# Patient Record
Sex: Male | Born: 1960 | Race: Black or African American | Hispanic: No | Smoking: Current every day smoker
Health system: Southern US, Community
[De-identification: ages and names within clinical notes are randomized; demographics above are authoritative.]

## PROBLEM LIST (undated history)

## (undated) ENCOUNTER — Emergency Department (HOSPITAL_COMMUNITY): Admission: EM | Discharge status: Against Medical Advice | Payer: No Typology Code available for payment source

## (undated) DIAGNOSIS — A539 Syphilis, unspecified: Secondary | ICD-10-CM

## (undated) DIAGNOSIS — IMO0002 Reserved for concepts with insufficient information to code with codable children: Secondary | ICD-10-CM

---

## 2002-01-15 ENCOUNTER — Emergency Department (HOSPITAL_COMMUNITY): Admission: EM | Admit: 2002-01-15 | Discharge: 2002-01-15 | Payer: Self-pay | Admitting: *Deleted

## 2003-03-30 ENCOUNTER — Emergency Department (HOSPITAL_COMMUNITY): Admission: EM | Admit: 2003-03-30 | Discharge: 2003-03-30 | Payer: Self-pay | Admitting: Emergency Medicine

## 2006-04-16 ENCOUNTER — Emergency Department (HOSPITAL_COMMUNITY): Admission: EM | Admit: 2006-04-16 | Discharge: 2006-04-16 | Payer: Self-pay | Admitting: Emergency Medicine

## 2007-02-22 ENCOUNTER — Ambulatory Visit: Payer: Self-pay | Admitting: Nurse Practitioner

## 2007-02-23 ENCOUNTER — Ambulatory Visit: Payer: Self-pay | Admitting: *Deleted

## 2007-02-28 ENCOUNTER — Ambulatory Visit: Payer: Self-pay | Admitting: Nurse Practitioner

## 2007-03-07 ENCOUNTER — Ambulatory Visit: Payer: Self-pay | Admitting: Nurse Practitioner

## 2007-03-15 ENCOUNTER — Ambulatory Visit: Payer: Self-pay | Admitting: Nurse Practitioner

## 2007-04-27 ENCOUNTER — Ambulatory Visit: Payer: Self-pay | Admitting: Family Medicine

## 2007-05-07 ENCOUNTER — Emergency Department (HOSPITAL_COMMUNITY): Admission: EM | Admit: 2007-05-07 | Discharge: 2007-05-07 | Payer: Self-pay | Admitting: Emergency Medicine

## 2007-05-19 ENCOUNTER — Emergency Department (HOSPITAL_COMMUNITY): Admission: EM | Admit: 2007-05-19 | Discharge: 2007-05-19 | Payer: Self-pay | Admitting: Family Medicine

## 2007-05-24 ENCOUNTER — Ambulatory Visit: Payer: Self-pay | Admitting: Internal Medicine

## 2007-06-15 ENCOUNTER — Ambulatory Visit (HOSPITAL_COMMUNITY): Admission: RE | Admit: 2007-06-15 | Discharge: 2007-06-15 | Payer: Self-pay | Admitting: Family Medicine

## 2007-07-15 ENCOUNTER — Emergency Department (HOSPITAL_COMMUNITY): Admission: EM | Admit: 2007-07-15 | Discharge: 2007-07-16 | Payer: Self-pay | Admitting: Emergency Medicine

## 2007-07-24 ENCOUNTER — Ambulatory Visit: Payer: Self-pay | Admitting: Internal Medicine

## 2007-09-19 ENCOUNTER — Ambulatory Visit: Payer: Self-pay | Admitting: Internal Medicine

## 2007-12-14 ENCOUNTER — Ambulatory Visit: Payer: Self-pay | Admitting: Internal Medicine

## 2008-01-02 ENCOUNTER — Ambulatory Visit: Payer: Self-pay | Admitting: Internal Medicine

## 2008-01-02 ENCOUNTER — Encounter (INDEPENDENT_AMBULATORY_CARE_PROVIDER_SITE_OTHER): Payer: Self-pay | Admitting: Nurse Practitioner

## 2008-01-02 LAB — CONVERTED CEMR LAB

## 2008-04-01 ENCOUNTER — Ambulatory Visit: Payer: Self-pay | Admitting: Internal Medicine

## 2008-05-17 ENCOUNTER — Ambulatory Visit: Payer: Self-pay | Admitting: Internal Medicine

## 2008-05-22 ENCOUNTER — Emergency Department (HOSPITAL_COMMUNITY): Admission: EM | Admit: 2008-05-22 | Discharge: 2008-05-22 | Payer: Self-pay | Admitting: Emergency Medicine

## 2008-05-28 ENCOUNTER — Emergency Department (HOSPITAL_COMMUNITY): Admission: EM | Admit: 2008-05-28 | Discharge: 2008-05-28 | Payer: Self-pay | Admitting: Emergency Medicine

## 2008-06-03 ENCOUNTER — Ambulatory Visit: Payer: Self-pay | Admitting: Internal Medicine

## 2008-06-03 ENCOUNTER — Encounter: Payer: Self-pay | Admitting: Family Medicine

## 2008-06-03 LAB — CONVERTED CEMR LAB
ALT: 15 units/L (ref 0–53)
Albumin: 4.2 g/dL (ref 3.5–5.2)
BUN: 16 mg/dL (ref 6–23)
Barbiturate Quant, Ur: NEGATIVE
CO2: 24 meq/L (ref 19–32)
Cocaine Metabolites: NEGATIVE
Creatinine, Ser: 1.11 mg/dL (ref 0.40–1.50)
Marijuana Metabolite: POSITIVE — AB
Sed Rate: 2 mm/hr (ref 0–16)
Sodium: 141 meq/L (ref 135–145)
Total Protein: 7.4 g/dL (ref 6.0–8.3)

## 2008-06-05 ENCOUNTER — Emergency Department (HOSPITAL_COMMUNITY): Admission: EM | Admit: 2008-06-05 | Discharge: 2008-06-05 | Payer: Self-pay | Admitting: Emergency Medicine

## 2008-07-31 ENCOUNTER — Emergency Department (HOSPITAL_COMMUNITY): Admission: EM | Admit: 2008-07-31 | Discharge: 2008-07-31 | Payer: Self-pay | Admitting: Emergency Medicine

## 2008-08-12 ENCOUNTER — Emergency Department (HOSPITAL_COMMUNITY): Admission: EM | Admit: 2008-08-12 | Discharge: 2008-08-12 | Payer: Self-pay | Admitting: Emergency Medicine

## 2008-08-16 ENCOUNTER — Emergency Department (HOSPITAL_COMMUNITY): Admission: EM | Admit: 2008-08-16 | Discharge: 2008-08-16 | Payer: Self-pay | Admitting: Emergency Medicine

## 2008-10-02 ENCOUNTER — Emergency Department (HOSPITAL_COMMUNITY): Admission: EM | Admit: 2008-10-02 | Discharge: 2008-10-02 | Payer: Self-pay | Admitting: Emergency Medicine

## 2008-10-23 ENCOUNTER — Emergency Department (HOSPITAL_COMMUNITY): Admission: EM | Admit: 2008-10-23 | Discharge: 2008-10-23 | Payer: Self-pay | Admitting: Emergency Medicine

## 2008-11-06 ENCOUNTER — Ambulatory Visit: Payer: Self-pay | Admitting: Family Medicine

## 2008-11-11 ENCOUNTER — Ambulatory Visit (HOSPITAL_COMMUNITY): Admission: RE | Admit: 2008-11-11 | Discharge: 2008-11-11 | Payer: Self-pay | Admitting: Family Medicine

## 2008-11-14 ENCOUNTER — Emergency Department (HOSPITAL_COMMUNITY): Admission: EM | Admit: 2008-11-14 | Discharge: 2008-11-14 | Payer: Self-pay | Admitting: Emergency Medicine

## 2009-01-16 ENCOUNTER — Observation Stay (HOSPITAL_COMMUNITY): Admission: EM | Admit: 2009-01-16 | Discharge: 2009-01-16 | Payer: Self-pay | Admitting: Emergency Medicine

## 2009-02-05 DIAGNOSIS — M5412 Radiculopathy, cervical region: Secondary | ICD-10-CM | POA: Insufficient documentation

## 2009-02-05 DIAGNOSIS — K7460 Unspecified cirrhosis of liver: Secondary | ICD-10-CM | POA: Insufficient documentation

## 2009-02-05 DIAGNOSIS — M7150 Other bursitis, not elsewhere classified, unspecified site: Secondary | ICD-10-CM | POA: Insufficient documentation

## 2009-02-05 DIAGNOSIS — R079 Chest pain, unspecified: Secondary | ICD-10-CM | POA: Insufficient documentation

## 2009-02-05 DIAGNOSIS — M5430 Sciatica, unspecified side: Secondary | ICD-10-CM | POA: Insufficient documentation

## 2009-04-04 ENCOUNTER — Ambulatory Visit: Payer: Self-pay | Admitting: Internal Medicine

## 2009-04-09 ENCOUNTER — Emergency Department (HOSPITAL_COMMUNITY): Admission: EM | Admit: 2009-04-09 | Discharge: 2009-04-09 | Payer: Self-pay | Admitting: Emergency Medicine

## 2009-06-27 ENCOUNTER — Ambulatory Visit: Payer: Self-pay | Admitting: Family Medicine

## 2009-07-13 ENCOUNTER — Emergency Department (HOSPITAL_COMMUNITY): Admission: EM | Admit: 2009-07-13 | Discharge: 2009-07-14 | Payer: Self-pay | Admitting: Emergency Medicine

## 2009-08-08 ENCOUNTER — Ambulatory Visit: Payer: Self-pay | Admitting: Internal Medicine

## 2009-11-06 ENCOUNTER — Ambulatory Visit: Payer: Self-pay | Admitting: Internal Medicine

## 2009-11-20 ENCOUNTER — Ambulatory Visit: Payer: Self-pay | Admitting: Internal Medicine

## 2009-11-20 LAB — CONVERTED CEMR LAB: Sed Rate: 7 mm/hr (ref 0–16)

## 2009-11-21 ENCOUNTER — Encounter (INDEPENDENT_AMBULATORY_CARE_PROVIDER_SITE_OTHER): Payer: Self-pay | Admitting: Internal Medicine

## 2009-11-27 ENCOUNTER — Ambulatory Visit: Payer: Self-pay | Admitting: Internal Medicine

## 2009-12-02 ENCOUNTER — Inpatient Hospital Stay (HOSPITAL_COMMUNITY): Admission: AC | Admit: 2009-12-02 | Discharge: 2009-12-05 | Payer: Self-pay

## 2010-02-04 ENCOUNTER — Emergency Department (HOSPITAL_COMMUNITY): Admission: EM | Admit: 2010-02-04 | Discharge: 2010-02-04 | Payer: Self-pay | Admitting: Emergency Medicine

## 2010-03-02 ENCOUNTER — Emergency Department (HOSPITAL_COMMUNITY): Admission: EM | Admit: 2010-03-02 | Discharge: 2010-03-02 | Payer: Self-pay | Admitting: Emergency Medicine

## 2010-03-26 ENCOUNTER — Emergency Department (HOSPITAL_COMMUNITY): Admission: EM | Admit: 2010-03-26 | Discharge: 2010-03-27 | Payer: Self-pay | Admitting: Emergency Medicine

## 2010-05-27 ENCOUNTER — Emergency Department (HOSPITAL_COMMUNITY): Admission: EM | Admit: 2010-05-27 | Discharge: 2010-05-27 | Payer: Self-pay | Admitting: Emergency Medicine

## 2010-07-22 ENCOUNTER — Emergency Department (HOSPITAL_COMMUNITY): Admission: EM | Admit: 2010-07-22 | Discharge: 2010-07-23 | Payer: Self-pay | Admitting: Emergency Medicine

## 2010-11-08 ENCOUNTER — Encounter: Payer: Self-pay | Admitting: Family Medicine

## 2010-11-30 ENCOUNTER — Ambulatory Visit: Payer: Self-pay | Admitting: Rehabilitative and Restorative Service Providers"

## 2010-11-30 ENCOUNTER — Emergency Department (HOSPITAL_COMMUNITY)
Admission: EM | Admit: 2010-11-30 | Discharge: 2010-11-30 | Disposition: A | Discharge status: Home / Self Care | Payer: Self-pay | Attending: Emergency Medicine | Admitting: Emergency Medicine

## 2010-11-30 DIAGNOSIS — M545 Low back pain, unspecified: Secondary | ICD-10-CM | POA: Insufficient documentation

## 2010-11-30 DIAGNOSIS — M546 Pain in thoracic spine: Secondary | ICD-10-CM | POA: Insufficient documentation

## 2010-11-30 DIAGNOSIS — G8929 Other chronic pain: Secondary | ICD-10-CM | POA: Insufficient documentation

## 2010-11-30 DIAGNOSIS — M542 Cervicalgia: Secondary | ICD-10-CM | POA: Insufficient documentation

## 2010-12-01 ENCOUNTER — Ambulatory Visit: Payer: Self-pay | Admitting: Rehabilitation

## 2010-12-14 ENCOUNTER — Ambulatory Visit: Payer: Self-pay | Attending: *Deleted | Admitting: Rehabilitative and Restorative Service Providers"

## 2011-01-04 LAB — CBC
HCT: 42.1 % (ref 39.0–52.0)
Hemoglobin: 14.3 g/dL (ref 13.0–17.0)
MCHC: 33.9 g/dL (ref 30.0–36.0)
MCV: 87.6 fL (ref 78.0–100.0)
RDW: 14.5 % (ref 11.5–15.5)
WBC: 11 10*3/uL — ABNORMAL HIGH (ref 4.0–10.5)

## 2011-01-04 LAB — COMPREHENSIVE METABOLIC PANEL
Albumin: 2.8 g/dL — ABNORMAL LOW (ref 3.5–5.2)
Alkaline Phosphatase: 88 U/L (ref 39–117)
BUN: 11 mg/dL (ref 6–23)
Chloride: 103 mEq/L (ref 96–112)
GFR calc Af Amer: >60 mL/min (ref >60)
Total Bilirubin: 0.6 mg/dL (ref 0.3–1.2)

## 2011-01-04 LAB — DIFFERENTIAL
Basophils Relative: 1 % (ref 0–1)
Eosinophils Absolute: 0.2 10*3/uL (ref 0.0–0.7)
Lymphocytes Relative: 19 % (ref 12–46)
Lymphs Abs: 2.1 10*3/uL (ref 0.7–4.0)
Monocytes Absolute: 0.8 10*3/uL (ref 0.1–1.0)
Neutro Abs: 7.8 10*3/uL — ABNORMAL HIGH (ref 1.7–7.7)
Neutrophils Relative %: 71 % (ref 43–77)

## 2011-01-04 LAB — PROTIME-INR: INR: 1.29 (ref 0.00–1.49)

## 2011-01-04 LAB — POCT CARDIAC MARKERS: Troponin i, poc: <0.05 ng/mL (ref 0.00–0.09)

## 2011-01-07 LAB — COMPREHENSIVE METABOLIC PANEL
ALT: 39 U/L (ref 0–53)
AST: 58 U/L — ABNORMAL HIGH (ref 0–37)
Albumin: 3 g/dL — ABNORMAL LOW (ref 3.5–5.2)
Alkaline Phosphatase: 77 U/L (ref 39–117)
BUN: 13 mg/dL (ref 6–23)
GFR calc Af Amer: >60 mL/min (ref >60)
Potassium: 4 mEq/L (ref 3.5–5.1)
Sodium: 137 mEq/L (ref 135–145)
Total Protein: 6.3 g/dL (ref 6.0–8.3)

## 2011-01-07 LAB — CBC
HCT: 39.3 % (ref 39.0–52.0)
Hemoglobin: 13.3 g/dL (ref 13.0–17.0)
Platelets: 222 10*3/uL (ref 150–400)
RBC: 4.45 MIL/uL (ref 4.22–5.81)
WBC: 11.3 10*3/uL — ABNORMAL HIGH (ref 4.0–10.5)

## 2011-01-07 LAB — POCT I-STAT, CHEM 8
Chloride: 108 meq/L (ref 96–112)
Creatinine, Ser: 1.6 mg/dL — ABNORMAL HIGH (ref 0.4–1.5)
Hemoglobin: 13.9 g/dL (ref 13.0–17.0)
Potassium: 3.8 meq/L (ref 3.5–5.1)
Sodium: 140 meq/L (ref 135–145)

## 2011-01-07 LAB — URINALYSIS, ROUTINE W REFLEX MICROSCOPIC
Nitrite: NEGATIVE
Specific Gravity, Urine: 1.01 (ref 1.005–1.030)
Urobilinogen, UA: 0.2 mg/dL (ref 0.0–1.0)

## 2011-01-07 LAB — PROTIME-INR: Prothrombin Time: 13.5 seconds (ref 11.6–15.2)

## 2011-01-07 LAB — RAPID URINE DRUG SCREEN, HOSP PERFORMED
Opiates: NOT DETECTED
Tetrahydrocannabinol: NOT DETECTED

## 2011-01-07 LAB — URINE MICROSCOPIC-ADD ON

## 2011-01-21 ENCOUNTER — Emergency Department (HOSPITAL_COMMUNITY): Payer: Self-pay

## 2011-01-21 ENCOUNTER — Emergency Department (HOSPITAL_COMMUNITY)
Admission: EM | Admit: 2011-01-21 | Discharge: 2011-01-21 | Disposition: A | Discharge status: Home / Self Care | Payer: Self-pay | Attending: Emergency Medicine | Admitting: Emergency Medicine

## 2011-01-21 DIAGNOSIS — R064 Hyperventilation: Secondary | ICD-10-CM | POA: Insufficient documentation

## 2011-01-21 DIAGNOSIS — R079 Chest pain, unspecified: Secondary | ICD-10-CM | POA: Insufficient documentation

## 2011-01-21 DIAGNOSIS — R071 Chest pain on breathing: Secondary | ICD-10-CM | POA: Insufficient documentation

## 2011-01-21 DIAGNOSIS — R0609 Other forms of dyspnea: Secondary | ICD-10-CM | POA: Insufficient documentation

## 2011-01-21 DIAGNOSIS — R0989 Other specified symptoms and signs involving the circulatory and respiratory systems: Secondary | ICD-10-CM | POA: Insufficient documentation

## 2011-01-21 LAB — POCT I-STAT, CHEM 8
BUN: 23 mg/dL (ref 6–23)
Chloride: 109 mEq/L (ref 96–112)
Sodium: 142 mEq/L (ref 135–145)

## 2011-01-21 LAB — POCT CARDIAC MARKERS
CKMB, poc: 3.7 ng/mL (ref 1.0–8.0)
Myoglobin, poc: 78.9 ng/mL (ref 12–200)
Troponin i, poc: <0.05 ng/mL (ref 0.00–0.09)

## 2011-01-27 LAB — CARDIAC PANEL(CRET KIN+CKTOT+MB+TROPI)
CK, MB: 5.7 ng/mL — ABNORMAL HIGH (ref 0.3–4.0)
Relative Index: 1.1 (ref 0.0–2.5)

## 2011-01-27 LAB — POCT CARDIAC MARKERS

## 2011-01-27 LAB — LIPID PANEL
LDL Cholesterol: 56 mg/dL (ref 0–99)
Triglycerides: 31 mg/dL (ref <150)

## 2011-01-27 LAB — TROPONIN I: Troponin I: <0.01 ng/mL (ref 0.00–0.06)

## 2011-01-27 LAB — HOMOCYSTEINE: Homocysteine: 7.9 umol/L (ref 4.0–15.4)

## 2011-01-27 LAB — TSH: TSH: 0.961 u[IU]/mL (ref 0.350–4.500)

## 2011-01-28 LAB — LIPASE, BLOOD: Lipase: 22 U/L (ref 11–59)

## 2011-01-28 LAB — CBC
MCV: 88.1 fL (ref 78.0–100.0)
Platelets: 204 10*3/uL (ref 150–400)
RDW: 14.6 % (ref 11.5–15.5)
WBC: 7.8 10*3/uL (ref 4.0–10.5)

## 2011-01-28 LAB — DIFFERENTIAL
Basophils Relative: 0 % (ref 0–1)
Eosinophils Absolute: 0.2 10*3/uL (ref 0.0–0.7)
Neutro Abs: 5.3 10*3/uL (ref 1.7–7.7)
Neutrophils Relative %: 67 % (ref 43–77)

## 2011-01-28 LAB — COMPREHENSIVE METABOLIC PANEL
AST: 32 U/L (ref 0–37)
Albumin: 3.5 g/dL (ref 3.5–5.2)
Chloride: 109 mEq/L (ref 96–112)
Creatinine, Ser: 1.22 mg/dL (ref 0.4–1.5)
GFR calc Af Amer: >60 mL/min (ref >60)
GFR calc non Af Amer: >60 mL/min (ref >60)
Total Protein: 6.3 g/dL (ref 6.0–8.3)

## 2011-01-28 LAB — ETHANOL: Alcohol, Ethyl (B): 19 mg/dL — ABNORMAL HIGH (ref 0–10)

## 2011-01-28 LAB — RAPID URINE DRUG SCREEN, HOSP PERFORMED: Barbiturates: NOT DETECTED

## 2011-01-28 LAB — D-DIMER, QUANTITATIVE: D-Dimer, Quant: 0.22 ug/mL-FEU (ref 0.00–0.48)

## 2011-01-28 LAB — POCT CARDIAC MARKERS

## 2011-02-17 ENCOUNTER — Emergency Department (HOSPITAL_COMMUNITY)
Admission: EM | Admit: 2011-02-17 | Discharge: 2011-02-17 | Disposition: A | Discharge status: Home / Self Care | Payer: Self-pay | Attending: Emergency Medicine | Admitting: Emergency Medicine

## 2011-02-17 DIAGNOSIS — M545 Low back pain, unspecified: Secondary | ICD-10-CM | POA: Insufficient documentation

## 2011-02-17 DIAGNOSIS — M2560 Stiffness of unspecified joint, not elsewhere classified: Secondary | ICD-10-CM | POA: Insufficient documentation

## 2011-02-17 DIAGNOSIS — M79609 Pain in unspecified limb: Secondary | ICD-10-CM | POA: Insufficient documentation

## 2011-02-17 DIAGNOSIS — M5430 Sciatica, unspecified side: Secondary | ICD-10-CM | POA: Insufficient documentation

## 2011-02-17 DIAGNOSIS — R262 Difficulty in walking, not elsewhere classified: Secondary | ICD-10-CM | POA: Insufficient documentation

## 2011-02-17 DIAGNOSIS — R209 Unspecified disturbances of skin sensation: Secondary | ICD-10-CM | POA: Insufficient documentation

## 2011-02-17 DIAGNOSIS — X58XXXA Exposure to other specified factors, initial encounter: Secondary | ICD-10-CM | POA: Insufficient documentation

## 2011-02-17 DIAGNOSIS — T148XXA Other injury of unspecified body region, initial encounter: Secondary | ICD-10-CM | POA: Insufficient documentation

## 2011-03-02 NOTE — H&P (Signed)
NAME:  Steven, [name] NO.:  [id number]   MEDICAL RECORD NO.:  [id number]          PATIENT TYPE:  INP   LOCATION:  3703                         FACILITY:  [facility]   PHYSICIAN:  Vania Rea, M.D. DATE OF BIRTH:  20-Aug-1961   DATE OF ADMISSION:  01/15/2009  DATE OF DISCHARGE:                              HISTORY & PHYSICAL   PRIMARY CARE PHYSICIAN:  [facility]   CHIEF COMPLAINT:  Chest pain.   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  While  at rest today, he had sudden onset of 8/10 left-sided chest squeezing  pain radiating to the left shoulder, associated with shortness of breath  and a feeling that he was about to pass out.  The patient became very  anxious and felt he should call the ambulance, which he did, and he  arrived to the emergency room within 25 minutes of the onset of his  symptoms.  The patient says the discomfort was relieved by elevating his  left arm above his head and seemed a bit aggravated by all the  questions that he was asked by the EMS personnel.  He had no  diaphoresis.  No nausea nor vomiting.  No palpitations.  In the  emergency room, he said once he laid down on the stretcher and was left  alone, the pain seemed to go away.  It was not  particular relieved by  any medication.  He has no prior history of cardiac disease or cardiac  symptoms.  He does have a mother who had an acute myocardial infarction  in her 20s.  Does not know the health of his father.  He does smoke.  He  does not know his lipid status.   The patient says he sometimes takes Percocet for his  pain but mostly  takes ibuprofen 800 mg daily p.r.n.  He is feeling okay and he is hoping  to be able to go home.   PAST MEDICAL HISTORY:  As noted above:  1. Cervical stenosis.  2. Sciatica.  3. History of bursitis.   MEDICATION:  Ibuprofen 800 mg daily p.r.n.   ALLERGIES:  No known drug allergies.   SOCIAL HISTORY:  He works as a Administrator, is physically very active.  He was working today.  He lives with his aunt.  He smokes 6-7 cigarettes  per day and smokes 1 marijuana cigar per day.  Drinks about three 12  ounces beers per day.  He last used cocaine about 2 weeks ago.   FAMILY HISTORY:  Mother and father as noted above.  His siblings he  feels are all healthy.   REVIEW OF SYSTEMS:  Other than noted above, 10-point review of systems  is unremarkable.  The patient says he can walk 2 blocks briskly  or  climbs stairs briskly without any chest discomfort at all.   PHYSICAL EXAMINATION:  A healthy but anxious looking young middle-aged  African American gentleman reclining on the stretcher, very athletic in  appearance.  VITALS:  His temperature is 97.6, pulse 62, respiration 14, blood  pressure 111/69.  He is saturating at 98% on 2 liters.  HEENT:  Pupils are round and equal.  Mucous membranes pink.  Anicteric.  NECK:  No cervical lymphadenopathy or thyromegaly.  No jugular venous  distention.  No carotid bruit.  CHEST:  Clear to auscultation bilaterally.  CARDIOVASCULAR SYSTEM:  Regular rhythm without murmurs.  ABDOMEN:  Scaphoid, soft and nontender.  There are no masses.  EXTREMITIES:  Without edema.  He has 2+ dorsalis pedis pulses equal  bilaterally.  CENTRAL NERVOUS SYSTEM: Cranial nerves II-XII are grossly intact and has  no focal neurologic deficit.   LABORATORY DATA:  His CBC is reviewed by me and is completely normal,  specifically his hemoglobin is 14.4.  His complete metabolic panel is  reviewed by me and is completely normal.  Incidental he denies any  history  of alcoholic liver disease.  Alcohol level was marginally  elevated at 19.  His lipase is 22.  D-dimer is normal at 0.22.  His  cardiac enzymes initially showed a mild elevation of his myoglobin with  undetectable troponins and CK-MB 4.9.  Repeat cardiac markers were all  normal.  A 2-view chest x-ray shows no active cardiopulmonary disease.  His lungs are clear.  His EKG shows normal sinus rhythm and flattening  of T-waves in the inferior leads.   ASSESSMENT:  1. Atypical chest pain.  2. Polysubstance abuse.  3. History of multiple musculoskeletal disorders in an otherwise      healthy young African American gentleman,   PLAN:  Will bring this gentleman on observation for serial cardiac  enzymes.  If cardiac enzymes are negative, the patient can likely be  followed and referred for outpatient cardiac stress testing since the  indications are that this is musculoskeletal syndrome.  Because of his  history of substance abuse, will go ahead and order an echo to assess  his ejection fraction to see if he has any possible ischemic or  nonischemic damage to his heart.      Vania Rea, M.D.  Electronically Signed     LC/MEDQ  D:  01/16/2009  T:  01/16/2009  Job:  [id number]

## 2011-03-02 NOTE — Discharge Summary (Signed)
NAME:  Steven [name], Steven [name] NO.:  [id number]   MEDICAL RECORD NO.:  [id number]           PATIENT TYPE:   LOCATION:                                 FACILITY:   PHYSICIAN:  Hind Bosie Helper, MD      DATE OF BIRTH:  August 07, 1961   DATE OF ADMISSION:  01/16/2009  DATE OF DISCHARGE:  01/17/2009                               DISCHARGE SUMMARY   DISCHARGE DIAGNOSES:  1. Chest pain, myocardial infarction was ruled out.  2. Nonprescription abuse, mainly tobacco, alcohol, and marijuana and      periodically cocaine.  3. History of cervical stenosis and sciatica.  4. History of bursitis.   DISCHARGE MEDICATIONS:  1. Aspirin 81 mg p.o. daily.  2. Ibuprofen 800 mg daily p.r.n.   PROCEDURE:  Chest x-ray negative for acute cardiopulmonary process.   HISTORY OF PRESENT ILLNESS:  This is a 50 year old male with a history  of chronic pain, mainly bursitis, sciatica, cervical spondylosis,  admitted with left-sided chest pain, mainly squeezing, radiating to the  left shoulder, associated with shortness of breath and feeling about to  pass out, and the pain relieved by elevating his left arm above his  head.  No diaphoresis.  Has EKG, which did show normal sinus rhythm and  flattening of the T-wave in the inferior lead.  The patient admitted to  telemetry.  Cardiac enzyme were cycled, which were negative, mainly  noticed to have elevated creatinine and kinase, total, which pain could  be muscular in nature.  There was no evidence of EKG changes.  We felt  the pain is mainly muscular in nature as evidence by elevated CK total  as evidenced by the patient clinically, which is relieved and more  severe by pressing his chest, but as the patient has a history of  cocaine and marijuana use.  A stress test was ordered, and an  appointment was made to see [facility] for evaluation for a  stress test on December 28, 2008 at 8:45.  The patient will be discharged  with aspirin and  ibuprofen.      Hind Bosie Helper, MD  Electronically Signed     HIE/MEDQ  D:  03/13/2009  T:  03/14/2009  Job:  [id number]

## 2011-07-30 ENCOUNTER — Emergency Department (HOSPITAL_COMMUNITY)
Admission: EM | Admit: 2011-07-30 | Discharge: 2011-07-30 | Discharge status: Against Medical Advice | Payer: Self-pay | Attending: Emergency Medicine | Admitting: Emergency Medicine

## 2011-07-30 DIAGNOSIS — R0602 Shortness of breath: Secondary | ICD-10-CM | POA: Insufficient documentation

## 2012-01-16 ENCOUNTER — Emergency Department (HOSPITAL_COMMUNITY)
Admission: EM | Admit: 2012-01-16 | Discharge: 2012-01-16 | Disposition: A | Discharge status: Home / Self Care | Payer: Self-pay | Attending: Emergency Medicine | Admitting: Emergency Medicine

## 2012-01-16 ENCOUNTER — Emergency Department (HOSPITAL_COMMUNITY): Payer: Self-pay

## 2012-01-16 ENCOUNTER — Encounter (HOSPITAL_COMMUNITY): Payer: Self-pay | Admitting: *Deleted

## 2012-01-16 DIAGNOSIS — J069 Acute upper respiratory infection, unspecified: Secondary | ICD-10-CM | POA: Insufficient documentation

## 2012-01-16 DIAGNOSIS — J329 Chronic sinusitis, unspecified: Secondary | ICD-10-CM | POA: Insufficient documentation

## 2012-01-16 DIAGNOSIS — J45909 Unspecified asthma, uncomplicated: Secondary | ICD-10-CM | POA: Insufficient documentation

## 2012-01-16 MED ORDER — HYDROCODONE-ACETAMINOPHEN 5-325 MG PO TABS
1.0000 | ORAL_TABLET | Freq: Once | ORAL | Status: AC
  Administered 2012-01-16: 1 via ORAL
  Filled 2012-01-16: qty 1

## 2012-01-16 MED ORDER — IBUPROFEN 800 MG PO TABS
800.0000 mg | ORAL_TABLET | Freq: Once | ORAL | Status: AC
  Administered 2012-01-16: 800 mg via ORAL
  Filled 2012-01-16: qty 1

## 2012-01-16 MED ORDER — HYDROCODONE-ACETAMINOPHEN 5-325 MG PO TABS: 1.0000 | ORAL_TABLET | ORAL | Status: AC | PRN

## 2012-01-16 MED ORDER — AMOXICILLIN 500 MG PO CAPS: 500.0000 mg | ORAL_CAPSULE | Freq: Three times a day (TID) | ORAL | Status: AC

## 2012-01-16 NOTE — ED Provider Notes (Signed)
History     CSN: [id number]  Arrival date & time 01/16/12  1942   First MD Initiated Contact with Patient 01/16/12 2055      Chief Complaint  Patient presents with  . Headache    (Consider location/radiation/quality/duration/timing/severity/associated sxs/prior treatment) Patient is a 51 y.o. male presenting with headaches. The history is provided by the patient.  Headache  This is a new problem. The current episode started more than 2 days ago. The problem occurs constantly. The problem has not changed since onset.The pain is located in the frontal and temporal region. The pain is at a severity of 10/10. The pain is severe. The pain does not radiate. Associated symptoms include a fever. Pertinent negatives include no shortness of breath, no nausea and no vomiting. He has tried acetaminophen and NSAIDs for the symptoms.   Patient four-day history of persistent headache. The headache is located in the frontal and bilateral temporal areas and is worse with position change. Patient describes the headache as throbbing in nature. Patient reports recent URI symptoms including productive cough, chills, and sinus congestion. Reports he believes he's had fever though he has not actually taken his temperature. States Tylenol and ibuprofen are not helping.   Past Medical History  Diagnosis Date  . Asthma     History reviewed. No pertinent past surgical history.  No family history on file.  History  Substance Use Topics  . Smoking status: Current Everyday Smoker  . Smokeless tobacco: Not on file  . Alcohol Use: Yes      Review of Systems  Constitutional: Positive for fever.  Eyes: Negative.   Respiratory: Negative.  Negative for shortness of breath.   Cardiovascular: Negative.   Gastrointestinal: Negative.  Negative for nausea and vomiting.  Genitourinary: Negative.   Musculoskeletal: Negative.   Skin: Negative.   Neurological: Positive for headaches.  Hematological: Negative.     Psychiatric/Behavioral: Negative.     Allergies  Review of patient's allergies indicates no known allergies.  Home Medications   Current Outpatient Rx  Name Route Sig Dispense Refill  . ACETAMINOPHEN 500 MG PO TABS Oral Take 1,000 mg by mouth every 6 (six) hours as needed. For pain    . BC HEADACHE POWDER PO Oral Take 1 packet by mouth every 6 (six) hours as needed. For headaches    . IBUPROFEN 200 MG PO TABS Oral Take 400 mg by mouth every 6 (six) hours as needed. For pain/headaches      BP 120/80  Pulse 93  Temp(Src) 98.7 F (37.1 C) (Oral)  Resp 16  SpO2 97%  Physical Exam  Constitutional: He appears well-developed and well-nourished.  HENT:  Head: Normocephalic and atraumatic.  Right Ear: Tympanic membrane, external ear and ear canal normal.  Left Ear: Tympanic membrane, external ear and ear canal normal.  Nose: Mucosal edema present. Right sinus exhibits no maxillary sinus tenderness and no frontal sinus tenderness. Left sinus exhibits no maxillary sinus tenderness and no frontal sinus tenderness.  Mouth/Throat: Uvula is midline and mucous membranes are normal.       Purulent nasal discharge.  Eyes: Conjunctivae are normal.  Neck: Neck supple.  Cardiovascular: Normal rate and regular rhythm.   Pulmonary/Chest: Effort normal and breath sounds normal.  Abdominal: Soft. Bowel sounds are normal.  Musculoskeletal: Normal range of motion.  Neurological: He is alert.  Skin: Skin is warm and dry.  Psychiatric: He has a normal mood and affect.    ED Course  Procedures   Patient  reports headache much improved with medication. Findings and clinical impression discussed. Will plan for discharge home with treatment for acute sinusitis and encourage close followup with his PCP at health service not improving. Patient agreeable with plan.  Labs Reviewed - No data to display Dg Chest 2 View  01/16/2012  *RADIOLOGY REPORT*  Clinical Data: Headache for 3 days.  Congestion,  cough, and fever.  CHEST - 2 VIEW  Comparison: 01/21/2011  Findings: Normal heart size and pulmonary vascularity.  No focal airspace consolidation in the lungs.  No blunting of costophrenic angles.  Mild hyperinflation.  Degenerative changes in the thoracic spine.  No pneumothorax.  No significant change since previous study.  IMPRESSION: No evidence of active pulmonary disease.  Original Report Authenticated By: Marlon Pel, M.D.     No diagnosis found.    MDM  HPI/PE and clinical findings c/w 1. Sinusitis 2. URI        Leanne Chang, NP 01/16/12 [phone]

## 2012-01-16 NOTE — ED Provider Notes (Signed)
Medical screening examination/treatment/procedure(s) were performed by non-physician practitioner and as supervising physician I was immediately available for consultation/collaboration.  Jerlean Peralta, MD 01/16/12 2359 

## 2012-01-16 NOTE — Discharge Instructions (Signed)
Please review the instructions below. You were seen in the emergency department tonight for your upper respiratory symptoms and persistent headache. Your chest x-ray tonight is normal. You do not have pneumonia. The likely source of your headache is Sinusitis (a sinus infection). We are treating this infection with antibiotic. Take the antibiotic as directed and be sure to complete it. Take Tylenol and/or ibuprofen for fever, pain and/or headache. Take the narcotic medication for pain as directed for more severe pain. Drink plenty of liquids and rest for the next one to 2 days. If your symptoms persist you will need to arrange follow up with your primary care doctor at [facility]. Return for any worsening symptoms.    Saline Nose Drops  To help clear a stuffy nose, put salt water (saline) nose drops in your infant's nose. This helps to loosen the secretions in the nose. Use a bulb syringe to clean the nose out:  Before feeding.   Before putting your infant down for naps.   No more than once every 3 hours to avoid irritating your infant's nostrils.  HOME CARE  Buy nose drops at your local drug store. You can also make nose drops yourself. Mix 1 cup of water with  teaspoon of salt. Stir. Store this mixture at room temperature. Make a new batch daily.   To use the drops:   Put 1 or 2 drops in each side of infant's nose with a clean medicine dropper. Do not use this dropper for any other medicine.   Squeeze the air out of the suction bulb before inserting it into your infant's nose.   While still squeezing the bulb flat, place the tip of the bulb into a nostril. Let air come back into the bulb. The suction will pull snot out of the nose and into the bulb.   Repeat on other nostril.   Squeeze the bulb several times into a tissue and wash the bulb tip in soapy water. Store the bulb with the tip side down on paper towel.   Use the bulb syringe with only the saline drops to avoid irritating  your infant's nostrils.  GET HELP RIGHT AWAY IF:  The snot changes to green or yellow.   The snot gets thicker.   Your infant is 3 months or younger with a rectal temperature of 100.4 F (38 C) or higher.   Your infant is older than 3 months with a rectal temperature of 102 F (38.9 C) or higher.   The stuffy nose lasts 10 days or longer.   There is trouble breathing or feeding.  MAKE SURE YOU:  Understand these instructions.   Will watch your infant's condition.   Will get help right away if your infant is not doing well or gets worse.  Document Released: 08/01/2009 Document Revised: 09/23/2011 Document Reviewed: 08/01/2009 [facility] Patient Information 2012 [place], [place].Sinusitis Sinusitis an infection of the air pockets (sinuses) in your face. This can cause puffiness (swelling). It can also cause drainage from your sinuses.  HOME CARE   Only take medicine as told by your doctor.   Drink enough fluids to keep your pee (urine) clear or pale yellow.   Apply moist heat or ice packs for pain relief.   Use salt (saline) nose sprays. The spray will wet the thick fluid in the nose. This can help the sinuses drain.  GET HELP RIGHT AWAY IF:   You have a fever.   Your baby is older than 3 months with  a rectal temperature of 102 F (38.9 C) or higher.   Your baby is 85 months old or younger with a rectal temperature of 100.4 F (38 C) or higher.   The pain gets worse.   You get a very bad headache.   You keep throwing up (vomiting).   Your face gets puffy.  MAKE SURE YOU:   Understand these instructions.   Will watch your condition.   Will get help right away if you are not doing well or get worse.  Document Released: 03/22/2008 Document Revised: 09/23/2011 Document Reviewed: 03/22/2008 [facility] Patient Information 2012 [place], [place].Upper Respiratory Infection, Adult An upper respiratory infection (URI) is also sometimes known as the common cold. The upper  respiratory tract includes the nose, sinuses, throat, trachea, and bronchi. Bronchi are the airways leading to the lungs. Most people improve within 1 week, but symptoms can last up to 2 weeks. A residual cough may last even longer.  CAUSES Many different viruses can infect the tissues lining the upper respiratory tract. The tissues become irritated and inflamed and often become very moist. Mucus production is also common. A cold is contagious. You can easily spread the virus to others by oral contact. This includes kissing, sharing a glass, coughing, or sneezing. Touching your mouth or nose and then touching a surface, which is then touched by another person, can also spread the virus. SYMPTOMS  Symptoms typically develop 1 to 3 days after you come in contact with a cold virus. Symptoms vary from person to person. They may include:  Runny nose.   Sneezing.   Nasal congestion.   Sinus irritation.   Sore throat.   Loss of voice (laryngitis).   Cough.   Fatigue.   Muscle aches.   Loss of appetite.   Headache.   Low-grade fever.  DIAGNOSIS  You might diagnose your own cold based on familiar symptoms, since most people get a cold 2 to 3 times a year. Your caregiver can confirm this based on your exam. Most importantly, your caregiver can check that your symptoms are not due to another disease such as strep throat, sinusitis, pneumonia, asthma, or epiglottitis. Blood tests, throat tests, and X-rays are not necessary to diagnose a common cold, but they may sometimes be helpful in excluding other more serious diseases. Your caregiver will decide if any further tests are required. RISKS AND COMPLICATIONS  You may be at risk for a more severe case of the common cold if you smoke cigarettes, have chronic heart disease (such as heart failure) or lung disease (such as asthma), or if you have a weakened immune system. The very young and very old are also at risk for more serious infections.  Bacterial sinusitis, middle ear infections, and bacterial pneumonia can complicate the common cold. The common cold can worsen asthma and chronic obstructive pulmonary disease (COPD). Sometimes, these complications can require emergency medical care and may be life-threatening. PREVENTION  The best way to protect against getting a cold is to practice good hygiene. Avoid oral or hand contact with people with cold symptoms. Wash your hands often if contact occurs. There is no clear evidence that vitamin C, vitamin E, echinacea, or exercise reduces the chance of developing a cold. However, it is always recommended to get plenty of rest and practice good nutrition. TREATMENT  Treatment is directed at relieving symptoms. There is no cure. Antibiotics are not effective, because the infection is caused by a virus, not by bacteria. Treatment may include:  Increased fluid intake. Sports drinks offer valuable electrolytes, sugars, and fluids.   Breathing heated mist or steam (vaporizer or shower).   Eating chicken soup or other clear broths, and maintaining good nutrition.   Getting plenty of rest.   Using gargles or lozenges for comfort.   Controlling fevers with ibuprofen or acetaminophen as directed by your caregiver.   Increasing usage of your inhaler if you have asthma.  Zinc gel and zinc lozenges, taken in the first 24 hours of the common cold, can shorten the duration and lessen the severity of symptoms. Pain medicines may help with fever, muscle aches, and throat pain. A variety of non-prescription medicines are available to treat congestion and runny nose. Your caregiver can make recommendations and may suggest nasal or lung inhalers for other symptoms.  HOME CARE INSTRUCTIONS   Only take over-the-counter or prescription medicines for pain, discomfort, or fever as directed by your caregiver.   Use a warm mist humidifier or inhale steam from a shower to increase air moisture. This may keep  secretions moist and make it easier to breathe.   Drink enough water and fluids to keep your urine clear or pale yellow.   Rest as needed.   Return to work when your temperature has returned to normal or as your caregiver advises. You may need to stay home longer to avoid infecting others. You can also use a face mask and careful hand washing to prevent spread of the virus.  SEEK MEDICAL CARE IF:   After the first few days, you feel you are getting worse rather than better.   You need your caregiver's advice about medicines to control symptoms.   You develop chills, worsening shortness of breath, or brown or red sputum. These may be signs of pneumonia.   You develop yellow or brown nasal discharge or pain in the face, especially when you bend forward. These may be signs of sinusitis.   You develop a fever, swollen neck glands, pain with swallowing, or white areas in the back of your throat. These may be signs of strep throat.  SEEK IMMEDIATE MEDICAL CARE IF:   You have a fever.   You develop severe or persistent headache, ear pain, sinus pain, or chest pain.   You develop wheezing, a prolonged cough, cough up blood, or have a change in your usual mucus (if you have chronic lung disease).   You develop sore muscles or a stiff neck.  Document Released: 03/30/2001 Document Revised: 09/23/2011 Document Reviewed: 02/05/2011 [facility] Patient Information 2012 [place], [place].

## 2012-01-16 NOTE — ED Notes (Signed)
Headache for 3-4 days.  Pain is not helped by meds.  He  Has a cold

## 2012-03-28 ENCOUNTER — Emergency Department (HOSPITAL_COMMUNITY): Payer: Self-pay

## 2012-03-28 ENCOUNTER — Emergency Department (HOSPITAL_COMMUNITY)
Admission: EM | Admit: 2012-03-28 | Discharge: 2012-03-28 | Disposition: A | Discharge status: Home / Self Care | Payer: Self-pay | Attending: Emergency Medicine | Admitting: Emergency Medicine

## 2012-03-28 ENCOUNTER — Encounter (HOSPITAL_COMMUNITY): Payer: Self-pay | Admitting: *Deleted

## 2012-03-28 DIAGNOSIS — R079 Chest pain, unspecified: Secondary | ICD-10-CM | POA: Insufficient documentation

## 2012-03-28 DIAGNOSIS — R918 Other nonspecific abnormal finding of lung field: Secondary | ICD-10-CM

## 2012-03-28 DIAGNOSIS — R222 Localized swelling, mass and lump, trunk: Secondary | ICD-10-CM | POA: Insufficient documentation

## 2012-03-28 DIAGNOSIS — F17200 Nicotine dependence, unspecified, uncomplicated: Secondary | ICD-10-CM | POA: Insufficient documentation

## 2012-03-28 LAB — CBC
HCT: 41.7 % (ref 39.0–52.0)
Hemoglobin: 14.3 g/dL (ref 13.0–17.0)
MCHC: 34.3 g/dL (ref 30.0–36.0)
MCV: 84.8 fL (ref 78.0–100.0)

## 2012-03-28 LAB — COMPREHENSIVE METABOLIC PANEL
Albumin: 3.4 g/dL — ABNORMAL LOW (ref 3.5–5.2)
BUN: 8 mg/dL (ref 6–23)
CO2: 26 mEq/L (ref 19–32)
Chloride: 103 mEq/L (ref 96–112)
Creatinine, Ser: 1.17 mg/dL (ref 0.50–1.35)
GFR calc non Af Amer: 71 mL/min — ABNORMAL LOW (ref >90)
Total Bilirubin: 0.2 mg/dL — ABNORMAL LOW (ref 0.3–1.2)

## 2012-03-28 LAB — DIFFERENTIAL
Basophils Relative: 0 % (ref 0–1)
Monocytes Absolute: 0.8 10*3/uL (ref 0.1–1.0)
Monocytes Relative: 7 % (ref 3–12)
Neutro Abs: 6.8 10*3/uL (ref 1.7–7.7)

## 2012-03-28 MED ORDER — IOHEXOL 350 MG/ML SOLN
100.0000 mL | Freq: Once | INTRAVENOUS | Status: AC | PRN
  Administered 2012-03-28: 100 mL via INTRAVENOUS

## 2012-03-28 MED ORDER — OXYCODONE-ACETAMINOPHEN 5-325 MG PO TABS
2.0000 | ORAL_TABLET | Freq: Once | ORAL | Status: AC
  Administered 2012-03-28: 2 via ORAL
  Filled 2012-03-28: qty 2

## 2012-03-28 MED ORDER — ALBUTEROL SULFATE (5 MG/ML) 0.5% IN NEBU
2.5000 mg | INHALATION_SOLUTION | Freq: Once | RESPIRATORY_TRACT | Status: AC
  Administered 2012-03-28: 2.5 mg via RESPIRATORY_TRACT
  Filled 2012-03-28: qty 0.5

## 2012-03-28 MED ORDER — OXYCODONE-ACETAMINOPHEN 5-325 MG PO TABS: 2.0000 | ORAL_TABLET | ORAL | Status: AC | PRN

## 2012-03-28 NOTE — ED Provider Notes (Signed)
History     CSN: [id number]  Arrival date & time 03/28/12  0010   First MD Initiated Contact with Patient 03/28/12 0341      Chief Complaint  Patient presents with  . rib pain     (Consider location/radiation/quality/duration/timing/severity/associated sxs/prior treatment) HPI 51 yo male presents to the ER with complaint of right sided midaxillary chest pain for the last 2-3 days.  He has had worsening cough productive of rust colored sputum.  He denies fever, but has had some chills.  No sick contacts.  Pt is a smoker.  No prior h/o similar pain.  No recent traumas.  No leg swelling, no h/o pe or dvt in self or family.    Past Medical History  Diagnosis Date  . Asthma     History reviewed. No pertinent past surgical history.  No family history on file.  History  Substance Use Topics  . Smoking status: Current Everyday Smoker  . Smokeless tobacco: Not on file  . Alcohol Use: Yes      Review of Systems  All other systems reviewed and are negative.    Allergies  Review of patient's allergies indicates no known allergies.  Home Medications   Current Outpatient Rx  Name Route Sig Dispense Refill  . OXYCODONE-ACETAMINOPHEN 5-325 MG PO TABS Oral Take 2 tablets by mouth every 4 (four) hours as needed for pain. 30 tablet 0    BP 121/81  Pulse 83  Temp(Src) 98.5 F (36.9 C) (Oral)  Resp 16  SpO2 97%  Physical Exam  Nursing note and vitals reviewed. Constitutional: He is oriented to person, place, and time. He appears well-developed and well-nourished. He appears distressed (uncomfortable appearing).  HENT:  Head: Normocephalic and atraumatic.  Eyes: Conjunctivae and EOM are normal. Pupils are equal, round, and reactive to light.  Neck: Normal range of motion. Neck supple. No JVD present. No tracheal deviation present. No thyromegaly present.  Cardiovascular: Normal rate, regular rhythm, normal heart sounds and intact distal pulses.  Exam reveals no gallop and  no friction rub.   No murmur heard. Pulmonary/Chest: Effort normal. No stridor. No respiratory distress. He has no wheezes. He has no rales. He exhibits tenderness.       Rhonchi in right lung  Abdominal: Soft. Bowel sounds are normal. He exhibits no distension and no mass. There is no tenderness. There is no rebound and no guarding.  Musculoskeletal: Normal range of motion. He exhibits no edema and no tenderness.  Lymphadenopathy:    He has no cervical adenopathy.  Neurological: He is alert and oriented to person, place, and time. He exhibits normal muscle tone. Coordination normal.  Skin: Skin is warm and dry. No rash noted. No erythema. No pallor.    ED Course  Procedures (including critical care time)  Labs Reviewed  CBC - Abnormal; Notable for the following:    WBC 10.7 (*)    All other components within normal limits  COMPREHENSIVE METABOLIC PANEL - Abnormal; Notable for the following:    Albumin 3.4 (*)    Alkaline Phosphatase 120 (*)    Total Bilirubin 0.2 (*)    GFR calc non Af Amer 71 (*)    GFR calc Af Amer 82 (*)    All other components within normal limits  DIFFERENTIAL   Dg Chest 2 View  03/28/2012  *RADIOLOGY REPORT*  Clinical Data: Right-sided chest pain.  CHEST - 2 VIEW  Comparison: 01/16/2012 and 01/21/2011.  Findings: Midline trachea.  Normal heart  size and mediastinal contours. No pleural effusion or pneumothorax.  Diffuse peribronchial thickening.  Suspect a nodular density projecting over the right upper lobe.  1.9 cm.  Left lung is clear.  IMPRESSION:  1.  Interstitial thickening is likely relate to chronic bronchitis/smoking. 2.  Suspect a right upper lobe pulmonary nodule.  Further evaluation with chest CT is recommended. These results were called by telephone on 03/28/2012  at  1:31 a.m. to  Thayer Ohm, r.n., who verbally acknowledged these results.  Original Report Authenticated By: Consuello Bossier, M.D.   Ct Angio Chest W/cm &/or Wo Cm  03/28/2012  *RADIOLOGY  REPORT*  Clinical Data: Pleuritic chest pain, smoker.  CT ANGIOGRAPHY CHEST  Technique:  Multidetector CT imaging of the chest using the standard protocol during bolus administration of intravenous contrast. Multiplanar reconstructed images including MIPs were obtained and reviewed to evaluate the vascular anatomy.  Contrast: OMNIPAQUE IOHEXOL 350 MG/ML SOLN  Comparison: 03/28/2012 radiograph, 01/16/2012 radiograph.  Findings: No pulmonary arterial branch filling defect.  Normal caliber aorta.  Normal heart size.  No pleural or pericardial effusion.  Coronary artery calcification.  No intrathoracic lymphadenopathy.  1.8 cm pleural based nodule right upper lobe, inseparable from the chest wall and major fissure. On the sagittal view (image 34) I cannot exclude chest wall invasion.  No pneumothorax.  Debris within the trachea layers along the right wall.  Central airways are otherwise patent.  No acute osseous finding.  Limited images through the upper abdomen show no acute abnormality.  IMPRESSION: 1.8 cm pleural based nodule within the right upper lobe. Given the relatively rapid development (was not present on 01/16/2012), infection is a consideration. However, as the patient does not have acute infectious symptoms, malignancy is the diagnosis of exclusion.  Recommend PET-CT or biopsy. Discussed via telephone with Dr. Norlene Campbell on 03/28/2012 at 5:45 am.  No pulmonary embolism.  Original Report Authenticated By: Waneta Martins, M.D.     1. Mass of lung       MDM  51 yo male with 3 days of right sided chest pain, and cxr concerning for mass.  CTA chest for possible pe with infarct shows mass with probable erosion into chest wall.  D/w patient concerning findings.  D/w Dr Cyndie Chime on for oncology who will try to work on getting him into the oncology clinic for workup of his probable lung cancer.  He requests ordering PET scan for staging and pain medications upon d/c home.       Olivia Mackie,  MD 03/28/12 [phone]

## 2012-03-28 NOTE — ED Notes (Signed)
Patient transported to CT 

## 2012-03-28 NOTE — ED Notes (Signed)
The radiologist has seen a nodule in the pts chest xray that he is suggesting a c-t of the chest

## 2012-03-28 NOTE — ED Notes (Signed)
Pt provided bag lunch, snacks, sprite. Pt a x 4. Told importance of following up with oncology. Pt verbalized understanding.

## 2012-03-28 NOTE — Discharge Instructions (Signed)
Please follow up with the oncology clinic.  Call the number listed for follow up in the next 1 week.  Take pain medications as prescribed.  Return to the ER for worsening condition or new concerning symptoms.

## 2012-03-28 NOTE — ED Notes (Signed)
The pt has been having rt sided rib [pain for the past 24-36 hours.  He has chest congestion and cough .

## 2012-03-28 NOTE — ED Notes (Signed)
Pt presented  to with rt side rib pain.Patient complains of tenderness and chest wall pain in the rt pleural side.pain on inspiration.Pt gives the history of old brown stain sputum for past few weeks.

## 2012-03-29 ENCOUNTER — Telehealth: Payer: Self-pay | Admitting: *Deleted

## 2012-03-29 NOTE — Telephone Encounter (Signed)
Spoke with pt regarding appt time and place for mtoc 04/06/12.  He verbalized understanding of time and place.  04/06/12 mtoc 3:00

## 2012-04-06 ENCOUNTER — Institutional Professional Consult (permissible substitution) (INDEPENDENT_AMBULATORY_CARE_PROVIDER_SITE_OTHER): Payer: Self-pay | Admitting: Cardiothoracic Surgery

## 2012-04-06 ENCOUNTER — Encounter: Payer: Self-pay | Admitting: Cardiothoracic Surgery

## 2012-04-06 VITALS — BP 134/93 | HR 76 | Resp 16 | Ht 65.5 in | Wt 138.0 lb

## 2012-04-06 DIAGNOSIS — R222 Localized swelling, mass and lump, trunk: Secondary | ICD-10-CM

## 2012-04-06 DIAGNOSIS — R918 Other nonspecific abnormal finding of lung field: Secondary | ICD-10-CM

## 2012-04-06 NOTE — Progress Notes (Signed)
[address]            [place] [address]          [phone]      Steven [name] [facility] Medical Record #[id number] Date of Birth: [birth year]  Referring: Olivia Mackie, MD Primary Care: Sheila Oats, MD  Chief Complaint:    Chief Complaint  Patient presents with  . Lung Mass    MTOC, Chest CT 03/28/2012    History of Present Illness:    Patient is a 51 year old male referred by the [facility] emergency room for questionable right lung lesion. The patient noted that last week he was at the hospital visiting a friend and was having right pleuritic chest pain. While he was at the hospital he decided to go to the emergency room to have it "checked out". A CT scan to rule out pulmonary embolus was performed. Because of the findings the patient was referred to thoracic surgery office. He notes that before this occurred he did have some coughing and sputum production that was slightly blood tinged. 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 He denies any definite history of tuberculosis or tuberculosis and family members.      Current Activity/ Functional Status: Patient is independent with mobility/ambulation, transfers, ADL's, IADL's.  Currently applying for disability   Past Medical History  Diagnosis Date  . Asthma     History reviewed. No pertinent past surgical history.  Family History  Problem Relation Age of Onset  . Asthma Mother   . Cancer- lung cancer Mother Died 69  Patient's father is alive, one brother expired from motor vehicle accident  History   Social History  . Marital Status: Legally Separated    Spouse Name: N/A    Number of Children: N/A  . Years of Education: N/A   Occupational History  .  applying for disability currently unemployed    Social History Main Topics  . Smoking status: Current Everyday Smoker  . Smokeless tobacco: Not on file  . Alcohol Use:  12.0 oz/week    20 Cans of beer per week  . Drug Use: Yes    Special: Marijuana  . Sexually Active:    Other Topics Concern  . Not on file   Social History Narrative  . No narrative on file    History  Smoking status  . Current Everyday Smoker patient notes that he has not had a cigarette for the past 12 days   Smokeless tobacco  . Not on file    History  Alcohol Use  . 12.0 oz/week  . 20 Cans of beer per week     No Known Allergies  Current Outpatient Prescriptions  Medication Sig Dispense Refill  . oxyCODONE-acetaminophen (PERCOCET) 5-325 MG per tablet Take 2 tablets by mouth every 4 (four) hours as needed for pain.  30 tablet  0       Review of Systems:     Cardiac Review of Systems: Y or N  Chest Pain [   y ]  Resting SOB [ n  ] Exertional SOB  [ y ]  Orthopnea [ n ]   Pedal Edema [ n  ]    Palpitations [n  ] Syncope  [n  ]   Presyncope [n   ]  General Review of Systems: [Y] =  yes [  ]=no Constitional: recent weight change [  ]; anorexia [ n ]; fatigue [ n ]; nausea [  ]; night sweats [n  ]; fever [ n ]; or chills [n  ];                                                                                                                                          Dental: poor dentition[n  ]; Last Dentist visit:   Eye : blurred vision [  ]; diplopia [   ]; vision changes [  ];  Amaurosis fugax[  ]; Resp: cough [  ];  wheezing[  ];  hemoptysis[  ]; shortness of breath[  ]; paroxysmal nocturnal dyspnea[  ]; dyspnea on exertion[  ]; or orthopnea[  ];  GI:  gallstones[  ], vomiting[  ];  dysphagia[  ]; melena[  ];  hematochezia [  ]; heartburn[  ];   Hx of  Colonoscopy[  ]; GU: kidney stones [  ]; hematuria[  ];   dysuria [  ];  nocturia[  ];  history of     obstruction [  ];             Skin: rash, swelling[  ];, hair loss[  ];  peripheral edema[  ];  or itching[  ]; Musculosketetal: myalgias[  ];  joint swelling[  ];  joint erythema[  ];  joint pain[  ];  back pain[   ];  Heme/Lymph: bruising[  ];  bleeding[  ];  anemia[  ];  Neuro: TIA[  ];  headaches[  ];  stroke[  ];  vertigo[  ];  seizures[  ];   paresthesias[  ];  difficulty walking[  ];  Psych:depression[  ]; anxiety[  ];  Endocrine: diabetes[  ];  thyroid dysfunction[  ];  Immunizations: Flu [ n ]; Pneumococcal[n  ];  Other:  Physical Exam: BP 134/93  Pulse 76  Resp 16  Ht 5' 5.5" (1.664 m)  Wt 138 lb (62.596 kg)  BMI 22.61 kg/m2  SpO2 98%  Physical Exam  Constitutional: He is oriented to person, place, and time. He appears well-developed.  HENT:  Head: Normocephalic.  Right Ear: External ear normal.  Neck: No JVD present. No tracheal deviation present. No thyromegaly present.  Cardiovascular: Normal rate, normal heart sounds and normal pulses.  Exam reveals no gallop.   No murmur heard. Respiratory: No respiratory distress. He has no wheezes. He has no rales.  GI: He exhibits no distension. There is no tenderness.  Musculoskeletal: Normal range of motion. He exhibits no edema and no tenderness.  Lymphadenopathy:    He has no cervical adenopathy.       Right cervical: No superficial cervical and no deep cervical adenopathy present.      Left cervical: No superficial cervical and no deep cervical adenopathy present.  Right axillary: No pectoral adenopathy present.       Left axillary: No pectoral adenopathy present. Neurological: He is alert and oriented to person, place, and time.  Psychiatric: He has a normal mood and affect. His behavior is normal. Judgment and thought content normal.   Diagnostic Studies & Laboratory data:     Recent Radiology Findings:  Dg Chest 2 View  03/28/2012  *RADIOLOGY REPORT*  Clinical Data: Right-sided chest pain.  CHEST - 2 VIEW  Comparison: 01/16/2012 and 01/21/2011.  Findings: Midline trachea.  Normal heart size and mediastinal contours. No pleural effusion or pneumothorax.  Diffuse peribronchial thickening.  Suspect a nodular density projecting  over the right upper lobe.  1.9 cm.  Left lung is clear.  IMPRESSION:  1.  Interstitial thickening is likely relate to chronic bronchitis/smoking. 2.  Suspect a right upper lobe pulmonary nodule.  Further evaluation with chest CT is recommended. These results were called by telephone on 03/28/2012  at  1:31 a.m. to  Thayer Ohm, r.n., who verbally acknowledged these results.  Original Report Authenticated By: Consuello Bossier, M.D.   Ct Angio Chest W/cm &/or Wo Cm  03/28/2012  *RADIOLOGY REPORT*  Clinical Data: Pleuritic chest pain, smoker.  CT ANGIOGRAPHY CHEST  Technique:  Multidetector CT imaging of the chest using the standard protocol during bolus administration of intravenous contrast. Multiplanar reconstructed images including MIPs were obtained and reviewed to evaluate the vascular anatomy.  Contrast: OMNIPAQUE IOHEXOL 350 MG/ML SOLN  Comparison: 03/28/2012 radiograph, 01/16/2012 radiograph.  Findings: No pulmonary arterial branch filling defect.  Normal caliber aorta.  Normal heart size.  No pleural or pericardial effusion.  Coronary artery calcification.  No intrathoracic lymphadenopathy.  1.8 cm pleural based nodule right upper lobe, inseparable from the chest wall and major fissure. On the sagittal view (image 34) I cannot exclude chest wall invasion.  No pneumothorax.  Debris within the trachea layers along the right wall.  Central airways are otherwise patent.  No acute osseous finding.  Limited images through the upper abdomen show no acute abnormality.  IMPRESSION: 1.8 cm pleural based nodule within the right upper lobe. Given the relatively rapid development (was not present on 01/16/2012), infection is a consideration. However, as the patient does not have acute infectious symptoms, malignancy is the diagnosis of exclusion.  Recommend PET-CT or biopsy. Discussed via telephone with Dr. Norlene Campbell on 03/28/2012 at 5:45 am.  No pulmonary embolism.  Original Report Authenticated By: Waneta Martins,  M.D.     Recent Lab Findings: Lab Results  Component Value Date   WBC 10.7* 03/28/2012   HGB 14.3 03/28/2012   HCT 41.7 03/28/2012   PLT 202 03/28/2012   GLUCOSE 92 03/28/2012   CHOL  Value: 99        ATP III CLASSIFICATION:  <200     mg/dL   Desirable  161-096  mg/dL   Borderline High  >=045    mg/dL   High        4/0/9811   TRIG 31 01/16/2009   HDL 37* 01/16/2009   LDLCALC  Value: 56        Total Cholesterol/HDL:CHD Risk Coronary Heart Disease Risk Table                     Men   Women  1/2 Average Risk   3.4   3.3  Average Risk       5.0   4.4  2 X Average Risk  9.6   7.1  3 X Average Risk  23.4   11.0        Use the calculated Patient Ratio above and the CHD Risk Table to determine the patient's CHD Risk.        ATP III CLASSIFICATION (LDL):  <100     mg/dL   Optimal  621-308  mg/dL   Near or Above                    Optimal  130-159  mg/dL   Borderline  657-846  mg/dL   High  >962     mg/dL   Very High 06/22/2840   ALT 19 03/28/2012   AST 20 03/28/2012   NA 138 03/28/2012   K 4.1 03/28/2012   CL 103 03/28/2012   CREATININE 1.17 03/28/2012   BUN 8 03/28/2012   CO2 26 03/28/2012   TSH 0.961Test methodology is 3rd generation TSH 01/16/2009   PSA 2.27 11/20/2009   INR 1.29 03/02/2010      Assessment / Plan:     Patient who has a long history of smoking presents with a right pleural based lung nodule that may be infectious or possibly malignant. It was not present on a chest x-ray in March. I discussed this finding with the patient. He was presented at the end conference this morning and the consensus was to proceed with needle biopsy. This will be arranged and I will see him back after fine needle aspiration is completed, the biopsy will also need to be sent for culture as this lesion may equally be of infectious rather than neoplastic origin.       Delight Ovens MD  Beeper [phone] Office [phone] 04/06/2012 3:46 PM

## 2012-04-07 ENCOUNTER — Other Ambulatory Visit: Payer: Self-pay | Admitting: Cardiothoracic Surgery

## 2012-04-07 DIAGNOSIS — D381 Neoplasm of uncertain behavior of trachea, bronchus and lung: Secondary | ICD-10-CM

## 2012-04-21 ENCOUNTER — Other Ambulatory Visit: Payer: Self-pay | Admitting: Radiology

## 2012-04-24 ENCOUNTER — Ambulatory Visit (HOSPITAL_COMMUNITY): Admission: RE | Admit: 2012-04-24 | Payer: Self-pay | Source: Ambulatory Visit

## 2012-04-24 ENCOUNTER — Other Ambulatory Visit (HOSPITAL_COMMUNITY): Payer: Self-pay | Admitting: Interventional Radiology

## 2012-04-24 DIAGNOSIS — R911 Solitary pulmonary nodule: Secondary | ICD-10-CM

## 2012-04-25 ENCOUNTER — Other Ambulatory Visit: Payer: Self-pay | Admitting: Radiology

## 2012-04-27 ENCOUNTER — Ambulatory Visit (HOSPITAL_COMMUNITY): Admission: RE | Admit: 2012-04-27 | Payer: Self-pay | Source: Ambulatory Visit

## 2012-04-27 ENCOUNTER — Inpatient Hospital Stay (HOSPITAL_COMMUNITY): Admission: RE | Admit: 2012-04-27 | Payer: Self-pay | Source: Ambulatory Visit

## 2012-04-28 ENCOUNTER — Other Ambulatory Visit: Payer: Self-pay | Admitting: Physician Assistant

## 2012-05-02 ENCOUNTER — Ambulatory Visit (HOSPITAL_COMMUNITY): Admission: RE | Admit: 2012-05-02 | Payer: Self-pay | Source: Ambulatory Visit

## 2012-05-02 ENCOUNTER — Ambulatory Visit (HOSPITAL_COMMUNITY)
Admission: RE | Admit: 2012-05-02 | Discharge: 2012-05-02 | Disposition: A | Discharge status: Home / Self Care | Payer: Self-pay | Source: Ambulatory Visit | Attending: Interventional Radiology | Admitting: Interventional Radiology

## 2012-05-02 DIAGNOSIS — R911 Solitary pulmonary nodule: Secondary | ICD-10-CM

## 2012-05-02 DIAGNOSIS — I2510 Atherosclerotic heart disease of native coronary artery without angina pectoris: Secondary | ICD-10-CM | POA: Insufficient documentation

## 2012-05-02 DIAGNOSIS — J984 Other disorders of lung: Secondary | ICD-10-CM | POA: Insufficient documentation

## 2012-05-02 DIAGNOSIS — I7090 Unspecified atherosclerosis: Secondary | ICD-10-CM | POA: Insufficient documentation

## 2012-05-04 ENCOUNTER — Telehealth: Payer: Self-pay | Admitting: Cardiothoracic Surgery

## 2012-10-11 ENCOUNTER — Emergency Department (HOSPITAL_COMMUNITY)
Admission: EM | Admit: 2012-10-11 | Discharge: 2012-10-11 | Disposition: A | Discharge status: Home / Self Care | Payer: Self-pay | Attending: Emergency Medicine | Admitting: Emergency Medicine

## 2012-10-11 ENCOUNTER — Encounter (HOSPITAL_COMMUNITY): Payer: Self-pay | Admitting: Adult Health

## 2012-10-11 DIAGNOSIS — R52 Pain, unspecified: Secondary | ICD-10-CM | POA: Insufficient documentation

## 2012-10-11 DIAGNOSIS — J45909 Unspecified asthma, uncomplicated: Secondary | ICD-10-CM | POA: Insufficient documentation

## 2012-10-11 DIAGNOSIS — J111 Influenza due to unidentified influenza virus with other respiratory manifestations: Secondary | ICD-10-CM | POA: Insufficient documentation

## 2012-10-11 DIAGNOSIS — F17200 Nicotine dependence, unspecified, uncomplicated: Secondary | ICD-10-CM | POA: Insufficient documentation

## 2012-10-11 DIAGNOSIS — F1210 Cannabis abuse, uncomplicated: Secondary | ICD-10-CM | POA: Insufficient documentation

## 2012-10-11 DIAGNOSIS — R05 Cough: Secondary | ICD-10-CM | POA: Insufficient documentation

## 2012-10-11 DIAGNOSIS — R059 Cough, unspecified: Secondary | ICD-10-CM | POA: Insufficient documentation

## 2012-10-11 DIAGNOSIS — M542 Cervicalgia: Secondary | ICD-10-CM | POA: Insufficient documentation

## 2012-10-11 DIAGNOSIS — G8929 Other chronic pain: Secondary | ICD-10-CM | POA: Insufficient documentation

## 2012-10-11 MED ORDER — IBUPROFEN 400 MG PO TABS
800.0000 mg | ORAL_TABLET | Freq: Once | ORAL | Status: AC
  Administered 2012-10-11: 800 mg via ORAL
  Filled 2012-10-11: qty 2

## 2012-10-11 MED ORDER — ACETAMINOPHEN 325 MG PO TABS: 650.0000 mg | ORAL_TABLET | Freq: Once | ORAL | Status: DC

## 2012-10-11 MED ORDER — OXYCODONE-ACETAMINOPHEN 5-325 MG PO TABS
1.0000 | ORAL_TABLET | Freq: Once | ORAL | Status: AC
  Administered 2012-10-11: 1 via ORAL
  Filled 2012-10-11: qty 1

## 2012-10-11 MED ORDER — OXYCODONE-ACETAMINOPHEN 5-325 MG PO TABS: 1.0000 | ORAL_TABLET | Freq: Four times a day (QID) | ORAL | Status: DC | PRN

## 2012-10-11 NOTE — ED Notes (Signed)
Presents with fever and generalized body aches that began today denies SOB, denies chest pain. Reports cough nonproductive. C/o neck pain that is chronic and worse today with the fever, states it feels like a sharp prick in his spine that radiates down to mid back.

## 2012-10-11 NOTE — ED Notes (Signed)
Pt c/o onset of fever, chills, non productive cough, generalized body aches, increased chronic neck, back and shoulder pain.

## 2012-10-11 NOTE — ED Provider Notes (Addendum)
History  This chart was scribed for Gwyneth Sprout, MD by Shari Heritage, ED Scribe. The patient was seen in room TR10C/TR10C. Patient's care was started at 1924.  CSN: [id number]  Arrival date & time 10/11/12  0865   First MD Initiated Contact with Patient 10/11/12 1924      Chief Complaint  Patient presents with  . Fever     The history is provided by the patient. A language interpreter was used.    HPI Comments: Steven [name] is a 51 y.o. male with a history of asthma who presents to the Emergency Department complaining of generalized body aches and fever onset this morning. There is associated nonproductive cough. Patient also reports some increased, sharp focal pain to posterior neck though he states that he has chronic neck pain. Patient denies sore throat, congestion, rhinorrhea or shortness of breath. He has not had a flu shot this season. Patient has been staying at a shelter and states he does have a spot there tonight. Patient says that he has access to tylenol and ibuprofen at the shelter.   Past Medical History  Diagnosis Date  . Asthma     History reviewed. No pertinent past surgical history.  Family History  Problem Relation Age of Onset  . Asthma Mother   . Cancer Mother     History  Substance Use Topics  . Smoking status: Current Every Day Smoker  . Smokeless tobacco: Not on file  . Alcohol Use: 12.0 oz/week    20 Cans of beer per week      Review of Systems A complete 10 system review of systems was obtained and all systems are negative except as noted in the HPI and PMH.   Allergies  Review of patient's allergies indicates no known allergies.  Home Medications  No current outpatient prescriptions on file.  Triage Vitals: BP 120/79  Pulse 109  Temp 101.1 F (38.4 C) (Oral)  Resp 22  SpO2 93%  Physical Exam  Constitutional: He is oriented to person, place, and time. He appears well-developed and well-nourished. No distress.  HENT:   Head: Normocephalic and atraumatic.  Right Ear: Tympanic membrane normal.  Left Ear: Tympanic membrane normal.  Mouth/Throat: Mucous membranes are normal. Posterior oropharyngeal erythema present. No oropharyngeal exudate or posterior oropharyngeal edema.  Eyes: EOM are normal.  Neck: Neck supple. Spinous process tenderness present. No muscular tenderness present. No Brudzinski's sign and no Kernig's sign noted.  Cardiovascular: Regular rhythm.  Tachycardia present.   No murmur heard. Pulmonary/Chest: Effort normal and breath sounds normal. No respiratory distress. He has no wheezes. He has no rales.  Abdominal: He exhibits no distension.  Musculoskeletal: Normal range of motion. He exhibits no edema and no tenderness.  Lymphadenopathy:    He has no cervical adenopathy.  Neurological: He is alert and oriented to person, place, and time.  Skin: Skin is warm and dry. No rash noted.    ED Course  Procedures (including critical care time) DIAGNOSTIC STUDIES: Oxygen Saturation is 93% on room air, adequate by my interpretation.    COORDINATION OF CARE: 7:27 PM- Patient informed of current plan for treatment and evaluation and agrees with plan at this time.      Labs Reviewed - No data to display No results found.   1. Influenza       MDM   Pt with symptoms consistent with influenza.  Normal exam here but is febrile to 101.  No signs of breathing difficulty  No  signs of strep pharyngitis, otitis or abnormal abdominal findings.   Pt can't afford tamiflu and is staying in a shelter.  Pt was informed of his infectious nature and given mask.  Also c/o of worsening of his chronic neck pain.  No meningeal signs.  Will continue antipyretica and rest and fluids and return for any further problems.       I personally performed the services described in this documentation, which was scribed in my presence.  The recorded information has been reviewed and considered.    Gwyneth Sprout, MD 10/11/12 1610  Gwyneth Sprout, MD 10/11/12 [phone]

## 2012-10-24 ENCOUNTER — Emergency Department (HOSPITAL_COMMUNITY)
Admission: EM | Admit: 2012-10-24 | Discharge: 2012-10-24 | Disposition: A | Discharge status: Home / Self Care | Payer: Self-pay | Attending: Emergency Medicine | Admitting: Emergency Medicine

## 2012-10-24 ENCOUNTER — Encounter (HOSPITAL_COMMUNITY): Payer: Self-pay | Admitting: *Deleted

## 2012-10-24 ENCOUNTER — Emergency Department (HOSPITAL_COMMUNITY): Payer: Self-pay

## 2012-10-24 DIAGNOSIS — F17200 Nicotine dependence, unspecified, uncomplicated: Secondary | ICD-10-CM | POA: Insufficient documentation

## 2012-10-24 DIAGNOSIS — M545 Low back pain, unspecified: Secondary | ICD-10-CM | POA: Insufficient documentation

## 2012-10-24 DIAGNOSIS — J45909 Unspecified asthma, uncomplicated: Secondary | ICD-10-CM | POA: Insufficient documentation

## 2012-10-24 DIAGNOSIS — R109 Unspecified abdominal pain: Secondary | ICD-10-CM | POA: Insufficient documentation

## 2012-10-24 DIAGNOSIS — R05 Cough: Secondary | ICD-10-CM | POA: Insufficient documentation

## 2012-10-24 DIAGNOSIS — R059 Cough, unspecified: Secondary | ICD-10-CM | POA: Insufficient documentation

## 2012-10-24 LAB — CBC WITH DIFFERENTIAL/PLATELET
Basophils Absolute: 0 10*3/uL (ref 0.0–0.1)
Basophils Relative: 0 % (ref 0–1)
Eosinophils Absolute: 0.1 10*3/uL (ref 0.0–0.7)
MCH: 28.8 pg (ref 26.0–34.0)
MCHC: 34.4 g/dL (ref 30.0–36.0)
Neutro Abs: 9.3 10*3/uL — ABNORMAL HIGH (ref 1.7–7.7)
Neutrophils Relative %: 74 % (ref 43–77)
Platelets: 307 10*3/uL (ref 150–400)
RDW: 14.3 % (ref 11.5–15.5)

## 2012-10-24 LAB — BASIC METABOLIC PANEL
BUN: 11 mg/dL (ref 6–23)
GFR calc Af Amer: 79 mL/min — ABNORMAL LOW (ref >90)
GFR calc non Af Amer: 68 mL/min — ABNORMAL LOW (ref >90)
Potassium: 4 mEq/L (ref 3.5–5.1)
Sodium: 134 mEq/L — ABNORMAL LOW (ref 135–145)

## 2012-10-24 LAB — URINALYSIS, ROUTINE W REFLEX MICROSCOPIC
Bilirubin Urine: NEGATIVE
Nitrite: NEGATIVE
Specific Gravity, Urine: 1.025 (ref 1.005–1.030)
Urobilinogen, UA: 0.2 mg/dL (ref 0.0–1.0)

## 2012-10-24 LAB — URINE MICROSCOPIC-ADD ON

## 2012-10-24 MED ORDER — GUAIFENESIN 100 MG/5ML PO SYRP: 100.0000 mg | ORAL_SOLUTION | ORAL | Status: DC | PRN

## 2012-10-24 MED ORDER — OXYCODONE-ACETAMINOPHEN 5-325 MG PO TABS
2.0000 | ORAL_TABLET | Freq: Once | ORAL | Status: AC
  Administered 2012-10-24: 2 via ORAL
  Filled 2012-10-24: qty 2

## 2012-10-24 MED ORDER — IBUPROFEN 600 MG PO TABS: 600.0000 mg | ORAL_TABLET | Freq: Four times a day (QID) | ORAL | Status: DC | PRN

## 2012-10-24 NOTE — ED Notes (Signed)
Pt is here with left and right sided back pain that started last nite.  No urinary pain or burning.  Pt states coughing up yellow sputum and reports sob.  No chest pain

## 2012-10-24 NOTE — ED Provider Notes (Signed)
History     CSN: [id number]  Arrival date & time 10/24/12  1346   First MD Initiated Contact with Patient 10/24/12 1409      Chief Complaint  Patient presents with  . Back Pain    (Consider location/radiation/quality/duration/timing/severity/associated sxs/prior treatment) Patient is a 52 y.o. male presenting with general illness. The history is provided by the patient. No language interpreter was used.  Illness  The current episode started more than 2 weeks ago. The onset is undetermined. The problem occurs continuously. The problem has been unchanged. The problem is moderate. Nothing relieves the symptoms. Nothing aggravates the symptoms. Associated symptoms include cough. Pertinent negatives include no fever, no abdominal pain, no constipation, no diarrhea, no nausea, no vomiting, no congestion, no headaches, no sore throat and no rash.    Past Medical History  Diagnosis Date  . Asthma     History reviewed. No pertinent past surgical history.  Family History  Problem Relation Age of Onset  . Asthma Mother   . Cancer Mother     History  Substance Use Topics  . Smoking status: Current Every Day Smoker  . Smokeless tobacco: Not on file  . Alcohol Use: 12.0 oz/week    20 Cans of beer per week      Review of Systems  Constitutional: Negative for fever and chills.  HENT: Negative for congestion and sore throat.   Respiratory: Positive for cough. Negative for shortness of breath.   Cardiovascular: Negative for chest pain and leg swelling.  Gastrointestinal: Negative for nausea, vomiting, abdominal pain, diarrhea and constipation.  Genitourinary: Positive for flank pain. Negative for dysuria and frequency.  Musculoskeletal: Positive for back pain.  Skin: Negative for color change and rash.  Neurological: Negative for dizziness and headaches.  Psychiatric/Behavioral: Negative for confusion and agitation.  All other systems reviewed and are negative.    Allergies    Review of patient's allergies indicates no known allergies.  Home Medications  No current outpatient prescriptions on file.  BP 134/95  Pulse 97  Temp 98.1 F (36.7 C) (Oral)  Resp 18  SpO2 98%  Physical Exam  Constitutional: He is oriented to person, place, and time. He appears well-developed and well-nourished. No distress.  HENT:  Head: Normocephalic and atraumatic.  Eyes: EOM are normal. Pupils are equal, round, and reactive to light.  Neck: Normal range of motion. Neck supple.  Cardiovascular: Normal rate and regular rhythm.   Pulmonary/Chest: Effort normal. No respiratory distress.  Abdominal: Soft. He exhibits no distension. There is no tenderness. There is no rigidity, no rebound, no guarding and no CVA tenderness.  Musculoskeletal: Normal range of motion. He exhibits no edema.       Lumbar back: He exhibits tenderness. He exhibits normal range of motion and no bony tenderness.       Back:  Neurological: He is alert and oriented to person, place, and time. He has normal strength. No sensory deficit.  Skin: Skin is warm and dry.  Psychiatric: He has a normal mood and affect. His behavior is normal.    ED Course  Procedures (including critical care time)  Results for orders placed during the hospital encounter of 10/24/12  CBC WITH DIFFERENTIAL      Component Value Range   WBC 12.7 (*) 4.0 - 10.5 K/uL   RBC 4.82  4.22 - 5.81 MIL/uL   Hemoglobin 13.9  13.0 - 17.0 g/dL   HCT 40.9  81.1 - 91.4 %   MCV 83.8  78.0 - 100.0 fL   MCH 28.8  26.0 - 34.0 pg   MCHC 34.4  30.0 - 36.0 g/dL   RDW 78.2  95.6 - 21.3 %   Platelets 307  150 - 400 K/uL   Neutrophils Relative 74  43 - 77 %   Neutro Abs 9.3 (*) 1.7 - 7.7 K/uL   Lymphocytes Relative 20  12 - 46 %   Lymphs Abs 2.5  0.7 - 4.0 K/uL   Monocytes Relative 6  3 - 12 %   Monocytes Absolute 0.8  0.1 - 1.0 K/uL   Eosinophils Relative 1  0 - 5 %   Eosinophils Absolute 0.1  0.0 - 0.7 K/uL   Basophils Relative 0  0 - 1 %    Basophils Absolute 0.0  0.0 - 0.1 K/uL  BASIC METABOLIC PANEL      Component Value Range   Sodium 134 (*) 135 - 145 mEq/L   Potassium 4.0  3.5 - 5.1 mEq/L   Chloride 98  96 - 112 mEq/L   CO2 23  19 - 32 mEq/L   Glucose, Bld 91  70 - 99 mg/dL   BUN 11  6 - 23 mg/dL   Creatinine, Ser 0.86  0.50 - 1.35 mg/dL   Calcium 9.4  8.4 - 57.8 mg/dL   GFR calc non Af Amer 68 (*) >90 mL/min   GFR calc Af Amer 79 (*) >90 mL/min  URINALYSIS, ROUTINE W REFLEX MICROSCOPIC      Component Value Range   Color, Urine YELLOW  YELLOW   APPearance CLEAR  CLEAR   Specific Gravity, Urine 1.025  1.005 - 1.030   pH 5.5  5.0 - 8.0   Glucose, UA NEGATIVE  NEGATIVE mg/dL   Hgb urine dipstick TRACE (*) NEGATIVE   Bilirubin Urine NEGATIVE  NEGATIVE   Ketones, ur NEGATIVE  NEGATIVE mg/dL   Protein, ur NEGATIVE  NEGATIVE mg/dL   Urobilinogen, UA 0.2  0.0 - 1.0 mg/dL   Nitrite NEGATIVE  NEGATIVE   Leukocytes, UA SMALL (*) NEGATIVE  URINE MICROSCOPIC-ADD ON      Component Value Range   Squamous Epithelial / LPF FEW (*) RARE   WBC, UA 7-10  <3 WBC/hpf   RBC / HPF 0-2  <3 RBC/hpf   Bacteria, UA RARE  RARE   Urine-Other TRICHOMONAS PRESENT      Dg Chest 2 View  10/24/2012  *RADIOLOGY REPORT*  Clinical Data: Cough, back pain, smoker, history asthma  CHEST - 2 VIEW  Comparison: 03/28/2012  Findings: Normal heart size, mediastinal contours, and pulmonary vascularity. Mild chronic peribronchial thickening. No pulmonary infiltrate, pleural effusion or pneumothorax. Minimal hyperaeration. Bones unremarkable.  IMPRESSION: Mild bronchitic changes minimal hyperaeration, chronic, question related to asthma or chronic bronchitis. No acute abnormalities.   Original Report Authenticated By: Ulyses Southward, M.D.      No diagnosis found.    MDM  Pt w/ hx of asthma presents to ED for left flank pain and cough. States he had the flu several weeks ago and has residual cough - yellow sputum and painful. Denies chest pain or fever or  malaise. Last pm developed left flank and left sided LBP. Denies injury. 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 Denies hematuria/dysuria/polyuria. No hx of kidney stones. Pain is aching, constant, exacerbated by movement. Non radiating. Denies radiculopathy, weakness or saddle anesthesia, no bowel/bladder dysfunction, not immunocompromised.   Exam: vitals unremarkable - afebrile, pulse 97, normotensive, not hypoxic or  in resp distress. NAD, ttp left paraspinal. No midline tenderness. No CVA tenderness, abd soft and benign - nttp, no guarding or rebound. Normal strength and sensation in BLE  Ddx/plan - likely benign musculoskeletal back pain. Doubt caudae equina, SEA, pathologic fx, AAA, pyelonephritis, nephrolithiasis. Will check u/a bmp and cbc. Cough likely viral  Vs residual from recent flu. Will check CXR. Will give po percocet for pain  Course: reassessed, vitals stable, NAD, pain improved w/ percocet. Labs unremarkable, u/a neg for infection, trace blood - not impressive for stone based on exam and hx, CXR - NACPF - no evidence of pneumonia. Based on these findings likely musculoskeletal lower back pain. Stable for d/c home. Will give rx for motrin 600mg  TID and robitussin. Recommend follow up w/ pcp. Given return precautions for worsening of pain and gross hematuria. D/c home in good and stable condition.    1. Lower back pain   2. Cough    New Prescriptions   GUAIFENESIN (ROBITUSSIN) 100 MG/5ML SYRUP    Take 5-10 mLs (100-200 mg total) by mouth every 4 (four) hours as needed for cough.   IBUPROFEN (ADVIL,MOTRIN) 600 MG TABLET    Take 1 tablet (600 mg total) by mouth every 6 (six) hours as needed for pain.   Provider Default, MD [address] [place] [address] [phone]    [facility] EMERGENCY DEPARTMENT [address] [id number] mc [place] [address] [phone]       Audelia Hives, MD 10/24/12 [phone]

## 2012-10-24 NOTE — Discharge Instructions (Signed)
Back Injury Prevention The following tips can help you to prevent a back injury. PHYSICAL FITNESS  Exercise often. Try to develop strong stomach (abdominal) muscles.  Do aerobic exercises often. This includes walking, jogging, biking, swimming.  Do exercises that help with balance and strength often. This includes tai chi and yoga.  Stretch before and after you exercise.  Keep a healthy weight. DIET   Ask your doctor how much calcium and vitamin D you need every day.  Include calcium in your diet. Foods high in calcium include dairy products; green, leafy vegetables; and products with calcium added (fortified).  Include vitamin D in your diet. Foods high in vitamin D include milk and products with vitamin D added.  Think about taking a multivitamin or other nutritional products called " supplements."  Stop smoking if you smoke. POSTURE   Sit and stand up straight. Avoid leaning forward or hunching over.  Choose chairs that support your lower back.  If you work at a desk:  Sit close to your work so you do not lean over.  Keep your chin tucked in.  Keep your neck drawn back.  Keep your elbows bent at a right angle. Your arms should look like the letter "L."  Sit high and close to the steering wheel when you drive. Add low back support to your car seat if needed.  Avoid sitting or standing in one position for too long. Get up and move around every hour. Take breaks if you are driving for a long time.  Sleep on your side with your knees slightly bent. You can also sleep on your back with a pillow under your knees. Do not sleep on your stomach. LIFTING, TWISTING, AND REACHING  Avoid heavy lifting, especially lifting over and over again. If you must do heavy lifting:  Stretch before lifting.  Work slowly.  Rest between lifts.  Use carts and dollies to move objects when possible.  Make several small trips instead of carrying 1 heavy load.  Ask for help when you  need it.  Ask for help when moving big, awkward objects.  Follow these steps when lifting:  Stand with your feet shoulder-width apart.  Get as close to the object as you can. Do not pick up heavy objects that are far from your body.  Use handles or lifting straps when possible.  Bend at your knees. Squat down, but keep your heels off the floor.  Keep your shoulders back, your chin tucked in, and your back straight.  Lift the object slowly. Tighten the muscles in your legs, stomach, and butt. Keep the object as close to the center of your body as possible.  Reverse these directions when you put a load down.  Do not:  Lift the object above your waist.  Twist at the waist while lifting or carrying a load. Move your feet if you need to turn, not your waist.  Bend over without bending at your knees.  Avoid reaching over your head, across a table, or for an object on a high surface. OTHER TIPS  Avoid wet floors and keep sidewalks clear of ice.  Do not sleep on a mattress that is too soft or too hard.  Keep items that you use often within easy reach.  Put heavier objects on shelves at waist level. Put lighter objects on lower or higher shelves.  Find ways to lessen your stress. You can try exercise, massage, or relaxation.  Get help for depression or anxiety if  needed. GET HELP IF:  You injure your back.  You have questions about diet, exercise, or other ways to prevent back injuries. MAKE SURE YOU:  Understand these instructions.  Will watch your condition.  Will get help right away if you are not doing well or get worse. Document Released: 03/22/2008 Document Revised: 12/27/2011 Document Reviewed: 11/15/2011 [facility] Patient Information 2013 [place], [place]. Back Pain, Adult Low back pain is very common. About 1 in 5 people have back pain.The cause of low back pain is rarely dangerous. The pain often gets better over time.About half of people with a sudden onset  of back pain feel better in just 2 weeks. About 8 in 10 people feel better by 6 weeks.  CAUSES Some common causes of back pain include:  Strain of the muscles or ligaments supporting the spine.  Wear and tear (degeneration) of the spinal discs.  Arthritis.  Direct injury to the back. DIAGNOSIS Most of the time, the direct cause of low back pain is not known.However, back pain can be treated effectively even when the exact cause of the pain is unknown.Answering your caregiver's questions about your overall health and symptoms is one of the most accurate ways to make sure the cause of your pain is not dangerous. If your caregiver needs more information, he or she may order lab work or imaging tests (X-rays or MRIs).However, even if imaging tests show changes in your back, this usually does not require surgery. HOME CARE INSTRUCTIONS For many people, back pain returns.Since low back pain is rarely dangerous, it is often a condition that people can learn to [facility] their own.   Remain active. It is stressful on the back to sit or stand in one place. Do not sit, drive, or stand in one place for more than 30 minutes at a time. Take short walks on level surfaces as soon as pain allows.Try to increase the length of time you walk each day.  Do not stay in bed.Resting more than 1 or 2 days can delay your recovery.  Do not avoid exercise or work.Your body is made to move.It is not dangerous to be active, even though your back may hurt.Your back will likely heal faster if you return to being active before your pain is gone.  Pay attention to your body when you bend and lift. Many people have less discomfortwhen lifting if they bend their knees, keep the load close to their bodies,and avoid twisting. Often, the most comfortable positions are those that put less stress on your recovering back.  Find a comfortable position to sleep. Use a firm mattress and lie on your side with your knees  slightly bent. If you lie on your back, put a pillow under your knees.  Only take over-the-counter or prescription medicines as directed by your caregiver. Over-the-counter medicines to reduce pain and inflammation are often the most helpful.Your caregiver may prescribe muscle relaxant drugs.These medicines help dull your pain so you can more quickly return to your normal activities and healthy exercise.  Put ice on the injured area.  Put ice in a plastic bag.  Place a towel between your skin and the bag.  Leave the ice on for 15 to 20 minutes, 3 to 4 times a day for the first 2 to 3 days. After that, ice and heat may be alternated to reduce pain and spasms.  Ask your caregiver about trying back exercises and gentle massage. This may be of some benefit.  Avoid feeling anxious  or stressed.Stress increases muscle tension and can worsen back pain.It is important to recognize when you are anxious or stressed and learn ways to manage it.Exercise is a great option. SEEK MEDICAL CARE IF:  You have pain that is not relieved with rest or medicine.  You have pain that does not improve in 1 week.  You have new symptoms.  You are generally not feeling well. SEEK IMMEDIATE MEDICAL CARE IF:   You have pain that radiates from your back into your legs.  You develop new bowel or bladder control problems.  You have unusual weakness or numbness in your arms or legs.  You develop nausea or vomiting.  You develop abdominal pain.  You feel faint. Document Released: 10/04/2005 Document Revised: 04/04/2012 Document Reviewed: 02/22/2011 [facility] Patient Information 2013 [place], [place]. Cough, Adult  A cough is a reflex. It helps you clear your throat and airways. A cough can help heal your body. A cough can last 2 or 3 weeks (acute) or may last more than 8 weeks (chronic). Some common causes of a cough can include an infection, allergy, or a cold. HOME CARE  Only take medicine as told by your  doctor.  If given, take your medicines (antibiotics) as told. Finish them even if you start to feel better.  Use a cold steam vaporizer or humidier in your home. This can help loosen thick spit (secretions).  Sleep so you are almost sitting up (semi-upright). Use pillows to do this. This helps reduce coughing.  Rest as needed.  Stop smoking if you smoke. GET HELP RIGHT AWAY IF:  You have yellowish-white fluid (pus) in your thick spit.  Your cough gets worse.  Your medicine does not reduce coughing, and you are losing sleep.  You cough up blood.  You have trouble breathing.  Your pain gets worse and medicine does not help.  You have a fever. MAKE SURE YOU:   Understand these instructions.  Will watch your condition.  Will get help right away if you are not doing well or get worse. Document Released: 06/17/2011 Document Revised: 12/27/2011 Document Reviewed: 06/17/2011 [facility] Patient Information 2013 [place], [place].

## 2012-10-24 NOTE — ED Notes (Signed)
Pt states  Left back started to hurt last night  Sat in chair in waiting rm and rt side started to hurt His brother had the flu on new years  Lifted the wood  Denies dysuria

## 2012-10-26 NOTE — ED Provider Notes (Signed)
I saw and evaluated the patient, reviewed the resident's note and I agree with the findings and plan. Low back pain, worse w bending and movement. Lumbar muscular tenderness. No rash/shingles to area. Spine nt. No cva tenderness. No abd pain or tenderness  Suzi Roots, MD 10/26/12 1623

## 2012-11-10 ENCOUNTER — Emergency Department (HOSPITAL_COMMUNITY)
Admission: EM | Admit: 2012-11-10 | Discharge: 2012-11-10 | Disposition: A | Discharge status: Home / Self Care | Payer: Self-pay | Attending: Emergency Medicine | Admitting: Emergency Medicine

## 2012-11-10 ENCOUNTER — Emergency Department (HOSPITAL_COMMUNITY): Payer: Self-pay

## 2012-11-10 ENCOUNTER — Encounter (HOSPITAL_COMMUNITY): Payer: Self-pay

## 2012-11-10 DIAGNOSIS — R062 Wheezing: Secondary | ICD-10-CM | POA: Insufficient documentation

## 2012-11-10 DIAGNOSIS — F17200 Nicotine dependence, unspecified, uncomplicated: Secondary | ICD-10-CM | POA: Insufficient documentation

## 2012-11-10 DIAGNOSIS — J40 Bronchitis, not specified as acute or chronic: Secondary | ICD-10-CM | POA: Insufficient documentation

## 2012-11-10 DIAGNOSIS — F1010 Alcohol abuse, uncomplicated: Secondary | ICD-10-CM | POA: Insufficient documentation

## 2012-11-10 DIAGNOSIS — F1210 Cannabis abuse, uncomplicated: Secondary | ICD-10-CM | POA: Insufficient documentation

## 2012-11-10 MED ORDER — PREDNISONE 20 MG PO TABS: ORAL_TABLET | ORAL | Status: DC

## 2012-11-10 MED ORDER — IPRATROPIUM BROMIDE 0.02 % IN SOLN
0.5000 mg | Freq: Once | RESPIRATORY_TRACT | Status: AC
  Administered 2012-11-10: 0.5 mg via RESPIRATORY_TRACT
  Filled 2012-11-10: qty 2.5

## 2012-11-10 MED ORDER — METHYLPREDNISOLONE SODIUM SUCC 125 MG IJ SOLR
125.0000 mg | Freq: Once | INTRAMUSCULAR | Status: AC
  Administered 2012-11-10: 125 mg via INTRAVENOUS
  Filled 2012-11-10: qty 2

## 2012-11-10 MED ORDER — ALBUTEROL SULFATE (5 MG/ML) 0.5% IN NEBU
5.0000 mg | INHALATION_SOLUTION | Freq: Once | RESPIRATORY_TRACT | Status: AC
  Administered 2012-11-10: 5 mg via RESPIRATORY_TRACT
  Filled 2012-11-10: qty 1

## 2012-11-10 MED ORDER — ALBUTEROL SULFATE HFA 108 (90 BASE) MCG/ACT IN AERS
2.0000 | INHALATION_SPRAY | Freq: Once | RESPIRATORY_TRACT | Status: AC
  Administered 2012-11-10: 2 via RESPIRATORY_TRACT
  Filled 2012-11-10: qty 6.7

## 2012-11-10 MED ORDER — SODIUM CHLORIDE 0.9 % IV BOLUS (SEPSIS)
1000.0000 mL | Freq: Once | INTRAVENOUS | Status: AC
  Administered 2012-11-10: 1000 mL via INTRAVENOUS

## 2012-11-10 NOTE — ED Notes (Signed)
Pt alert and oriented x4. Respirations uneven and labored. Bilateral rise and fall of chest. Pt on 15L aerosol mask.  Denies needs.

## 2012-11-10 NOTE — ED Notes (Signed)
MD at bedside. 

## 2012-11-10 NOTE — ED Provider Notes (Signed)
History     CSN: [id number]  Arrival date & time 11/10/12  0702   First MD Initiated Contact with Patient 11/10/12 [phone]      Chief Complaint  Patient presents with  . Shortness of Breath    (Consider location/radiation/quality/duration/timing/severity/associated sxs/prior treatment) HPI... cough, wheezing for 24 hours. Status post fluid diagnosis approximately one month ago. Symptoms from that episode had resolved. He is a cigarette smoker.  Severity is mild to moderate. No chest pain, fever, sweats, chills, rusty sputum.  Past Medical History  Diagnosis Date  . Asthma     History reviewed. No pertinent past surgical history.  Family History  Problem Relation Age of Onset  . Asthma Mother   . Cancer Mother     History  Substance Use Topics  . Smoking status: Current Every Day Smoker  . Smokeless tobacco: Not on file  . Alcohol Use: 12.0 oz/week    20 Cans of beer per week      Review of Systems  All other systems reviewed and are negative.    Allergies  Review of patient's allergies indicates no known allergies.  Home Medications   Current Outpatient Rx  Name  Route  Sig  Dispense  Refill  . IBUPROFEN 200 MG PO TABS   Oral   Take 400 mg by mouth every 8 (eight) hours as needed. For pain.         Marland Kitchen PREDNISONE 20 MG PO TABS      3 tabs po day one, then 2 tabs daily x 4 days   11 tablet   0     BP 136/80  Pulse 101  Temp 98.4 F (36.9 C) (Oral)  Resp 23  SpO2 96%  Physical Exam  Nursing note and vitals reviewed. Constitutional: He is oriented to person, place, and time. He appears well-developed and well-nourished.  HENT:  Head: Normocephalic and atraumatic.  Eyes: Conjunctivae normal and EOM are normal. Pupils are equal, round, and reactive to light.  Neck: Normal range of motion. Neck supple.  Cardiovascular: Normal rate, regular rhythm and normal heart sounds.   Pulmonary/Chest: Effort normal.       Slight bilateral expiratory wheeze    Abdominal: Soft. Bowel sounds are normal.  Musculoskeletal: Normal range of motion.  Neurological: He is alert and oriented to person, place, and time.  Skin: Skin is warm and dry.  Psychiatric: He has a normal mood and affect.    ED Course  Procedures (including critical care time)  Labs Reviewed - No data to display Dg Chest 2 View  11/10/2012  *RADIOLOGY REPORT*  Clinical Data: Cough and shortness of breath.  CHEST - 2 VIEW  Comparison: 10/24/2012.  Findings: The cardiac silhouette, mediastinal and hilar contours are within normal limits and stable.  The lungs demonstrate bronchitic type changes with peribronchial thickening, increased interstitial markings and streaky areas of atelectasis.  No focal infiltrates, pleural effusion or pulmonary edema.  The bony thorax is intact.  IMPRESSION: Findings suggest bronchitis.  No focal infiltrates.   Original Report Authenticated By: Rudie Meyer, M.D.      1. Bronchitis       MDM  Good oxygenation. Patient feeling better after albuterol Atrovent breathing treatment. IV Solu-Medrol given.  Discharge home on prednisone for 5 days and albuterol inhaler.        Donnetta Hutching, MD 11/10/12 1012

## 2012-11-10 NOTE — ED Notes (Signed)
Respiratory at bedside.

## 2012-11-10 NOTE — ED Notes (Signed)
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

## 2012-11-10 NOTE — ED Notes (Signed)
Pt escorted to discharge window. Verbalized understanding discharge instructions. In no acute distress.   

## 2012-11-10 NOTE — ED Notes (Signed)
Patient transported to X-ray 

## 2012-11-10 NOTE — ED Notes (Signed)
Per EMS: Pt dx flu x 1 month; Symptoms returned with SOB that increasingly got worse throughout the night. Wheezing present without chest pain. 5mg  albuterol neb. Pt ambulatory on scene.

## 2012-11-10 NOTE — ED Notes (Signed)
Pt taken off aerosol mask and [place] 3L applied.  Pt's Sat remaining at 98-99%.

## 2013-05-19 ENCOUNTER — Observation Stay (HOSPITAL_COMMUNITY)
Admission: EM | Admit: 2013-05-19 | Discharge: 2013-05-20 | Disposition: A | Discharge status: Home / Self Care | Payer: MEDICAID | Attending: Neurosurgery | Admitting: Neurosurgery

## 2013-05-19 ENCOUNTER — Encounter (HOSPITAL_COMMUNITY): Payer: Self-pay | Admitting: Emergency Medicine

## 2013-05-19 ENCOUNTER — Emergency Department (HOSPITAL_COMMUNITY): Payer: Self-pay

## 2013-05-19 DIAGNOSIS — S065X9A Traumatic subdural hemorrhage with loss of consciousness of unspecified duration, initial encounter: Secondary | ICD-10-CM

## 2013-05-19 DIAGNOSIS — Y33XXXA Other specified events, undetermined intent, initial encounter: Secondary | ICD-10-CM | POA: Insufficient documentation

## 2013-05-19 DIAGNOSIS — S06339A Contusion and laceration of cerebrum, unspecified, with loss of consciousness of unspecified duration, initial encounter: Principal | ICD-10-CM | POA: Insufficient documentation

## 2013-05-19 DIAGNOSIS — S06330A Contusion and laceration of cerebrum, unspecified, without loss of consciousness, initial encounter: Secondary | ICD-10-CM

## 2013-05-19 LAB — BASIC METABOLIC PANEL
BUN: 14 mg/dL (ref 6–23)
CO2: 22 mEq/L (ref 19–32)
Calcium: 9 mg/dL (ref 8.4–10.5)
Chloride: 99 mEq/L (ref 96–112)
Creatinine, Ser: 1.03 mg/dL (ref 0.50–1.35)
GFR calc Af Amer: >90 mL/min (ref >90)
GFR calc non Af Amer: 82 mL/min — ABNORMAL LOW (ref >90)
Glucose, Bld: 104 mg/dL — ABNORMAL HIGH (ref 70–99)
Potassium: 3.8 mEq/L (ref 3.5–5.1)
Sodium: 133 mEq/L — ABNORMAL LOW (ref 135–145)

## 2013-05-19 LAB — CBC WITH DIFFERENTIAL/PLATELET
Basophils Absolute: 0 10*3/uL (ref 0.0–0.1)
Basophils Relative: 0 % (ref 0–1)
Eosinophils Absolute: 0 10*3/uL (ref 0.0–0.7)
Eosinophils Relative: 0 % (ref 0–5)
HCT: 42.4 % (ref 39.0–52.0)
Hemoglobin: 15.2 g/dL (ref 13.0–17.0)
Lymphocytes Relative: 15 % (ref 12–46)
Lymphs Abs: 2 10*3/uL (ref 0.7–4.0)
MCH: 30 pg (ref 26.0–34.0)
MCHC: 35.8 g/dL (ref 30.0–36.0)
MCV: 83.6 fL (ref 78.0–100.0)
Monocytes Absolute: 1.1 10*3/uL — ABNORMAL HIGH (ref 0.1–1.0)
Monocytes Relative: 8 % (ref 3–12)
Neutro Abs: 10.4 10*3/uL — ABNORMAL HIGH (ref 1.7–7.7)
Neutrophils Relative %: 77 % (ref 43–77)
Platelets: 162 10*3/uL (ref 150–400)
RBC: 5.07 MIL/uL (ref 4.22–5.81)
RDW: 14.2 % (ref 11.5–15.5)
WBC: 13.6 10*3/uL — ABNORMAL HIGH (ref 4.0–10.5)

## 2013-05-19 LAB — ETHANOL: Alcohol, Ethyl (B): <11 mg/dL (ref 0–11)

## 2013-05-19 LAB — MRSA PCR SCREENING: MRSA by PCR: NEGATIVE

## 2013-05-19 MED ORDER — ONDANSETRON HCL 4 MG/2ML IJ SOLN
4.0000 mg | Freq: Once | INTRAMUSCULAR | Status: AC
  Administered 2013-05-19: 4 mg via INTRAVENOUS
  Filled 2013-05-19: qty 2

## 2013-05-19 MED ORDER — LACTATED RINGERS IV SOLN
INTRAVENOUS | Status: DC
  Administered 2013-05-19: 18:00:00 via INTRAVENOUS
  Filled 2013-05-19: qty 1000

## 2013-05-19 MED ORDER — VITAMIN B-1 100 MG PO TABS
100.0000 mg | ORAL_TABLET | Freq: Every day | ORAL | Status: DC
  Administered 2013-05-19 – 2013-05-20 (×2): 100 mg via ORAL
  Filled 2013-05-19 (×2): qty 1

## 2013-05-19 MED ORDER — ADULT MULTIVITAMIN W/MINERALS CH
1.0000 | ORAL_TABLET | Freq: Every day | ORAL | Status: DC
  Administered 2013-05-19 – 2013-05-20 (×2): 1 via ORAL
  Filled 2013-05-19 (×2): qty 1

## 2013-05-19 MED ORDER — HYDROCODONE-ACETAMINOPHEN 5-325 MG PO TABS
1.0000 | ORAL_TABLET | ORAL | Status: DC | PRN
  Administered 2013-05-19: 1 via ORAL
  Filled 2013-05-19: qty 1

## 2013-05-19 MED ORDER — ONDANSETRON HCL 4 MG PO TABS: 4.0000 mg | ORAL_TABLET | Freq: Four times a day (QID) | ORAL | Status: DC | PRN

## 2013-05-19 MED ORDER — HYDROCODONE-ACETAMINOPHEN 5-325 MG PO TABS
2.0000 | ORAL_TABLET | ORAL | Status: DC | PRN
  Administered 2013-05-19 – 2013-05-20 (×5): 2 via ORAL
  Filled 2013-05-19 (×4): qty 2

## 2013-05-19 MED ORDER — ACETAMINOPHEN 325 MG PO TABS: 650.0000 mg | ORAL_TABLET | ORAL | Status: DC | PRN

## 2013-05-19 MED ORDER — MORPHINE SULFATE 4 MG/ML IJ SOLN
4.0000 mg | Freq: Once | INTRAMUSCULAR | Status: AC
  Administered 2013-05-19: 4 mg via INTRAVENOUS
  Filled 2013-05-19: qty 1

## 2013-05-19 MED ORDER — ONDANSETRON HCL 4 MG/2ML IJ SOLN: 4.0000 mg | Freq: Four times a day (QID) | INTRAMUSCULAR | Status: DC | PRN

## 2013-05-19 MED ORDER — THIAMINE HCL 100 MG/ML IJ SOLN
100.0000 mg | Freq: Every day | INTRAMUSCULAR | Status: DC
  Filled 2013-05-19 (×2): qty 1

## 2013-05-19 MED ORDER — FOLIC ACID 1 MG PO TABS
1.0000 mg | ORAL_TABLET | Freq: Every day | ORAL | Status: DC
  Administered 2013-05-19 – 2013-05-20 (×2): 1 mg via ORAL
  Filled 2013-05-19 (×2): qty 1

## 2013-05-19 MED ORDER — LORAZEPAM 2 MG/ML IJ SOLN: 1.0000 mg | Freq: Four times a day (QID) | INTRAMUSCULAR | Status: DC | PRN

## 2013-05-19 MED ORDER — LORAZEPAM 1 MG PO TABS: 1.0000 mg | ORAL_TABLET | Freq: Four times a day (QID) | ORAL | Status: DC | PRN

## 2013-05-19 NOTE — ED Notes (Signed)
BIB [facility]. Per EMS came home yesterday with swelling and laceration to left (per family). Refused to go to hospital yesterday. Family called EMS this am. EMS arrival, PT sitting couch, neuro intact, no focal neuro, a/o for EMS, family stated PT is confused. PT willingly came with EMS. Ambulatory at home, NO neck, back complaints or claim to injury. Swelling/bruising present to left side of face w/ small lac (controlled).

## 2013-05-19 NOTE — ED Notes (Signed)
Report attempted 
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
## 2013-05-19 NOTE — ED Notes (Signed)
Patient transported to CT 

## 2013-05-19 NOTE — ED Notes (Signed)
Dr. Jenkins at bedside. 
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
## 2013-05-19 NOTE — ED Notes (Signed)
Pt refusing CT at this time.  States he is" good, is not bleeding"and does not want to lie on the hard surface of the table. MD and [facility] notified by Lenard Simmer
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
## 2013-05-19 NOTE — Progress Notes (Signed)
Pt received from ED.  Oriented to room and to unit routine.  States does not want to stay.  Encouraged to stay for observation purposes for his safety.  Introduced to Haematologist.  States wants to leave.  We addressed with him that it would not be in his best interest.  Bed alarm in place.  Pt. Attempted to get OOB without assist, almost pulling out IV.  Affect disgruntled.

## 2013-05-20 ENCOUNTER — Observation Stay (HOSPITAL_COMMUNITY): Payer: Self-pay

## 2013-05-20 MED ORDER — HYDROCODONE-ACETAMINOPHEN 5-325 MG PO TABS: 2.0000 | ORAL_TABLET | ORAL | Status: DC | PRN

## 2013-05-20 MED ORDER — ADULT MULTIVITAMIN W/MINERALS CH: 1.0000 | ORAL_TABLET | Freq: Every day | ORAL | Status: DC

## 2013-05-20 NOTE — ED Provider Notes (Addendum)
Medical screening examination/treatment/procedure(s) were conducted as a shared visit with non-physician practitioner(s) and myself.  I personally evaluated the patient during the encounter Pt with facial swelling, laceration.  ICH seen on CT, admitted by neurosurgery.  Rolan Bucco, MD 05/20/13 0700  Rolan Bucco, MD 05/30/13 [phone]

## 2013-05-20 NOTE — Discharge Summary (Signed)
  Physician Discharge Summary  Patient ID: Steven [name] MRN: [id number] DOB/AGE: 01-22-1961 52 y.o.  Admit date: 05/19/2013 Discharge date: 05/20/2013  Admission Diagnoses: Cerebral contusions, subdural hematoma  Discharge Diagnoses: The same Active Problems:   * No active hospital problems. *   Discharged Condition: good  Hospital Course: I admitted the patient to [facility] on 05/19/13. The patient was observed in the ICU. A followup head CT on 05/20/2013 demonstrated no significant change in his hematoma, was stable. The patient requested discharge to home. He was given oral and written discharge instructions. All his questions were answered.  Consults: None Significant Diagnostic Studies: Head CT Treatments: None Discharge Exam: Blood pressure 141/94, pulse 56, temperature 100 F (37.8 C), temperature source Oral, resp. rate 19, height 5\' 6"  (1.676 m), weight 56.3 kg (124 lb 1.9 oz), SpO2 94.00%. The patient is alert and oriented x3. His pupils are equal round reactive light, his strength is normal.  Disposition: Home  Discharge Orders   Future Orders Complete By Expires     Call MD for:  difficulty breathing, headache or visual disturbances  As directed     Call MD for:  extreme fatigue  As directed     Call MD for:  hives  As directed     Call MD for:  persistant dizziness or light-headedness  As directed     Call MD for:  persistant nausea and vomiting  As directed     Call MD for:  redness, tenderness, or signs of infection (pain, swelling, redness, odor or green/yellow discharge around incision site)  As directed     Call MD for:  severe uncontrolled pain  As directed     Call MD for:  temperature >100.4  As directed     Diet - low sodium heart healthy  As directed     Discharge instructions  As directed     Comments:      Call [phone] for a followup appointment. Take a stool softener while you are using pain medications.    Driving Restrictions  As  directed     Comments:      Do not drive for 2 weeks.    Increase activity slowly  As directed     Lifting restrictions  As directed     Comments:      Do not lift more than 5 pounds. No excessive bending or twisting.    May shower / Bathe  As directed     Comments:      He may shower after the pain she is removed 3 days after surgery. Leave the incision alone.        Medication List         HYDROcodone-acetaminophen 5-325 MG per tablet  Commonly known as:  NORCO/VICODIN  Take 2 tablets by mouth every 4 (four) hours as needed.     multivitamin with minerals Tabs  Take 1 tablet by mouth daily.         SignedCristi Loron 05/20/2013, 9:29 AM

## 2013-05-20 NOTE — Progress Notes (Signed)
Discharge order received. Reviewed discharge instructions and medications including indications, administration and side effects. Pt acknowledged discharge instructions and declined any further assistance from nursing at this time. Pt was discharged to the main entrance via wheelchair. Family was instructed with information as well.

## 2013-05-31 ENCOUNTER — Encounter (HOSPITAL_COMMUNITY): Payer: Self-pay | Admitting: Emergency Medicine

## 2013-05-31 ENCOUNTER — Emergency Department (HOSPITAL_COMMUNITY)
Admission: EM | Admit: 2013-05-31 | Discharge: 2013-05-31 | Discharge status: Against Medical Advice | Payer: Self-pay | Attending: Emergency Medicine | Admitting: Emergency Medicine

## 2013-05-31 DIAGNOSIS — F17200 Nicotine dependence, unspecified, uncomplicated: Secondary | ICD-10-CM | POA: Insufficient documentation

## 2013-05-31 DIAGNOSIS — S0993XA Unspecified injury of face, initial encounter: Secondary | ICD-10-CM | POA: Insufficient documentation

## 2013-05-31 DIAGNOSIS — S0990XA Unspecified injury of head, initial encounter: Secondary | ICD-10-CM | POA: Insufficient documentation
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
## 2013-05-31 NOTE — Progress Notes (Signed)
[facility] provided patient with a [employer]. Patient has a purple card through the [facility]. Patient stated that he is pending Disability and trying to get Medicaid. Patient stated that he can not afford any pain medications because the [facility] does not fill the pain medication that he was given at hospital last week.

## 2013-05-31 NOTE — Progress Notes (Addendum)
Consulted by [facility] liaison about pt concern that [facility] did not fill pain medication Pt d/c with vicodin 05/19/13 and offered during this ED visit Vicodin will not be filled by [facility] nor is a medication covered by [facility] MATCH program   CM spoke with pt who confirms self pay [facility] resident with no pcp. CM discussed and provided written information for self pay pcps, importance of pcp for f/u care, www.needymeds.org, discounted pharmacies and other [place] resources such as financial assistance, DSS and  health department Discussed with him that vicodin is a narcotic that is generally filled at a local pharmacy. Reviewed resources for [place] self pay pcps like [facility], family medicine at [address], [facility] family practice, general medical clinics, [facility] urgent care plus others, [facility] out patient pharmacies and housing Pt voiced understanding and appreciation of resources provided

## 2013-06-11 ENCOUNTER — Encounter (HOSPITAL_COMMUNITY): Payer: Self-pay | Admitting: Emergency Medicine

## 2013-06-11 ENCOUNTER — Emergency Department (HOSPITAL_COMMUNITY): Payer: Medicaid Other

## 2013-06-11 ENCOUNTER — Emergency Department (HOSPITAL_COMMUNITY)
Admission: EM | Admit: 2013-06-11 | Discharge: 2013-06-11 | Disposition: A | Discharge status: Home / Self Care | Payer: Medicaid Other | Attending: Emergency Medicine | Admitting: Emergency Medicine

## 2013-06-11 DIAGNOSIS — R51 Headache: Secondary | ICD-10-CM | POA: Diagnosis present

## 2013-06-11 DIAGNOSIS — F17200 Nicotine dependence, unspecified, uncomplicated: Secondary | ICD-10-CM | POA: Diagnosis not present

## 2013-06-11 DIAGNOSIS — J45909 Unspecified asthma, uncomplicated: Secondary | ICD-10-CM | POA: Insufficient documentation

## 2013-06-11 DIAGNOSIS — R209 Unspecified disturbances of skin sensation: Secondary | ICD-10-CM | POA: Diagnosis not present

## 2013-06-11 DIAGNOSIS — R519 Headache, unspecified: Secondary | ICD-10-CM

## 2013-06-11 DIAGNOSIS — F0781 Postconcussional syndrome: Secondary | ICD-10-CM | POA: Insufficient documentation

## 2013-06-11 MED ORDER — HYDROCODONE-ACETAMINOPHEN 5-325 MG PO TABS
2.0000 | ORAL_TABLET | Freq: Once | ORAL | Status: AC
  Administered 2013-06-11: 2 via ORAL
  Filled 2013-06-11: qty 2

## 2013-06-11 NOTE — Progress Notes (Signed)
[facility] provided patient with a list of primary care resources in [facility].
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
## 2013-06-14 NOTE — ED Provider Notes (Signed)
Medical screening examination/treatment/procedure(s) were performed by non-physician practitioner and as supervising physician I was immediately available for consultation/collaboration.   Sheridan Santiaga Butzin, MD 06/14/13 0729 

## 2013-06-29 ENCOUNTER — Emergency Department (HOSPITAL_COMMUNITY)
Admission: EM | Admit: 2013-06-29 | Discharge: 2013-06-29 | Disposition: A | Discharge status: Home / Self Care | Payer: Medicaid Other | Attending: Emergency Medicine | Admitting: Emergency Medicine

## 2013-06-29 ENCOUNTER — Encounter (HOSPITAL_COMMUNITY): Payer: Self-pay | Admitting: Emergency Medicine

## 2013-06-29 ENCOUNTER — Emergency Department (HOSPITAL_COMMUNITY): Payer: Medicaid Other

## 2013-06-29 DIAGNOSIS — J45909 Unspecified asthma, uncomplicated: Secondary | ICD-10-CM | POA: Insufficient documentation

## 2013-06-29 DIAGNOSIS — F0781 Postconcussional syndrome: Secondary | ICD-10-CM | POA: Diagnosis not present

## 2013-06-29 DIAGNOSIS — Z79899 Other long term (current) drug therapy: Secondary | ICD-10-CM | POA: Insufficient documentation

## 2013-06-29 DIAGNOSIS — R51 Headache: Secondary | ICD-10-CM | POA: Diagnosis present

## 2013-06-29 DIAGNOSIS — F17200 Nicotine dependence, unspecified, uncomplicated: Secondary | ICD-10-CM | POA: Insufficient documentation

## 2013-06-29 MED ORDER — ACETAMINOPHEN 500 MG PO TABS
1000.0000 mg | ORAL_TABLET | Freq: Once | ORAL | Status: AC
  Administered 2013-06-29: 1000 mg via ORAL
  Filled 2013-06-29: qty 2

## 2013-06-29 NOTE — ED Notes (Addendum)
Pt. reports left forehead pain for several days , seen here 06/11/2013 diagnosed with concussion syndrome , no recent injury , alert and oriented , no nausea or vomitting , no visual problems.

## 2013-06-29 NOTE — ED Notes (Signed)
Pt not signing for discharge papers and left them in the room after pulling out his previous visits discharge instructions and looking at them. States he "still has a HA and sat here all this time for what". Dr. Nicanor Alcon explained to pt that neurosurgery needed to be followed up with concerning proper treatment for post concussive syndrome.
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
## 2013-06-29 NOTE — ED Notes (Signed)
Dr. Nicanor Alcon back at the bedside. Explaining to patient she is unable to refill medication and needs to follow up with Dr. Lovell Sheehan as discussed.

## 2013-11-05 ENCOUNTER — Emergency Department (HOSPITAL_COMMUNITY): Payer: Medicaid Other

## 2013-11-05 ENCOUNTER — Encounter (HOSPITAL_COMMUNITY): Payer: Self-pay | Admitting: Emergency Medicine

## 2013-11-05 ENCOUNTER — Emergency Department (HOSPITAL_COMMUNITY)
Admission: EM | Admit: 2013-11-05 | Discharge: 2013-11-05 | Disposition: A | Discharge status: Home / Self Care | Payer: Medicaid Other | Attending: Emergency Medicine | Admitting: Emergency Medicine

## 2013-11-05 DIAGNOSIS — F17200 Nicotine dependence, unspecified, uncomplicated: Secondary | ICD-10-CM | POA: Insufficient documentation

## 2013-11-05 DIAGNOSIS — J45909 Unspecified asthma, uncomplicated: Secondary | ICD-10-CM | POA: Insufficient documentation

## 2013-11-05 DIAGNOSIS — N390 Urinary tract infection, site not specified: Secondary | ICD-10-CM

## 2013-11-05 DIAGNOSIS — R319 Hematuria, unspecified: Secondary | ICD-10-CM

## 2013-11-05 LAB — URINALYSIS, ROUTINE W REFLEX MICROSCOPIC
Bilirubin Urine: NEGATIVE
Glucose, UA: NEGATIVE mg/dL
Ketones, ur: NEGATIVE mg/dL
NITRITE: NEGATIVE
PH: 6 (ref 5.0–8.0)
Protein, ur: NEGATIVE mg/dL
SPECIFIC GRAVITY, URINE: 1.028 (ref 1.005–1.030)
UROBILINOGEN UA: 0.2 mg/dL (ref 0.0–1.0)

## 2013-11-05 LAB — URINE MICROSCOPIC-ADD ON

## 2013-11-05 LAB — BASIC METABOLIC PANEL
BUN: 12 mg/dL (ref 6–23)
CO2: 24 mEq/L (ref 19–32)
Calcium: 8.8 mg/dL (ref 8.4–10.5)
Chloride: 104 mEq/L (ref 96–112)
Creatinine, Ser: 1.04 mg/dL (ref 0.50–1.35)
GFR calc Af Amer: >90 mL/min (ref >90)
GFR calc non Af Amer: 81 mL/min — ABNORMAL LOW (ref >90)
Glucose, Bld: 117 mg/dL — ABNORMAL HIGH (ref 70–99)
Potassium: 3.8 mEq/L (ref 3.7–5.3)
Sodium: 138 mEq/L (ref 137–147)

## 2013-11-05 LAB — CBC
HCT: 43.9 % (ref 39.0–52.0)
Hemoglobin: 14.9 g/dL (ref 13.0–17.0)
MCH: 28.8 pg (ref 26.0–34.0)
MCHC: 33.9 g/dL (ref 30.0–36.0)
MCV: 84.7 fL (ref 78.0–100.0)
Platelets: 211 10*3/uL (ref 150–400)
RBC: 5.18 MIL/uL (ref 4.22–5.81)
RDW: 15 % (ref 11.5–15.5)
WBC: 7 10*3/uL (ref 4.0–10.5)

## 2013-11-05 MED ORDER — CIPROFLOXACIN HCL 500 MG PO TABS: 500.0000 mg | ORAL_TABLET | Freq: Two times a day (BID) | ORAL | Status: DC

## 2013-11-05 NOTE — Discharge Instructions (Signed)
Be sure to drink plenty of water, at least six 8oz glasses of water per day. Try to avoid sugary drinks such as juice and sodas. Be sure to complete all antibiotics. Follow up with primary care if symptoms not improving. Return to ER if NEW or worsening symptoms develop.

## 2013-11-05 NOTE — ED Provider Notes (Signed)
Medical screening examination/treatment/procedure(s) were performed by non-physician practitioner and as supervising physician I was immediately available for consultation/collaboration.  EKG Interpretation   None        Jasper Riling. Alvino Chapel, MD 11/05/13 2358

## 2013-11-05 NOTE — ED Provider Notes (Signed)
CSN: [id number]     Arrival date & time 11/05/13  1428 History   First MD Initiated Contact with Patient 11/05/13 1703     Chief Complaint  Patient presents with  . Hematuria  . Flank Pain   (Consider location/radiation/quality/duration/timing/severity/associated sxs/prior Treatment) HPI Pt is a 53yo male presenting with hematuria that started gradually last night with small amount of dark blood. This morning pt reports steady stream of bright red blood in urine. Also c/o mild, 1-2/10 aching left flank pain that started 3-4 hours ago. Pt states he came to ED because he was concerned about color of his urine. Denies fever, n/v/d. Denies abdominal pain, dysuria, penile discharge, testicular pain swelling or masses. Denies hx of renal stones. Denies hx of UTIs. Reports having 1 new sexual partner but states he uses condoms. No new medications.  Past Medical History  Diagnosis Date  . Asthma    History reviewed. No pertinent past surgical history. Family History  Problem Relation Age of Onset  . Asthma Mother   . Cancer Mother    History  Substance Use Topics  . Smoking status: Current Every Day Smoker  . Smokeless tobacco: Never Used  . Alcohol Use: 12.0 oz/week    20 Cans of beer per week     Comment: every other day- moderate intake    Review of Systems  Constitutional: Negative for fever and chills.  Gastrointestinal: Negative for nausea, vomiting, abdominal pain and diarrhea.  Genitourinary: Positive for hematuria and flank pain. Negative for dysuria, urgency, frequency, decreased urine volume, discharge, penile swelling, scrotal swelling, penile pain and testicular pain.  Musculoskeletal: Negative for back pain and myalgias.  All other systems reviewed and are negative.    Allergies  Review of patient's allergies indicates no known allergies.  Home Medications   Current Outpatient Rx  Name  Route  Sig  Dispense  Refill  . ibuprofen (ADVIL,MOTRIN) 200 MG tablet    Oral   Take 400 mg by mouth every 6 (six) hours as needed (pain).         . Multiple Vitamins-Minerals (MULTIVITAMIN WITH MINERALS) tablet   Oral   Take 1 tablet by mouth daily.         . ciprofloxacin (CIPRO) 500 MG tablet   Oral   Take 1 tablet (500 mg total) by mouth 2 (two) times daily.   6 tablet   0    BP 143/93  Pulse 74  Temp(Src) 98.5 F (36.9 C) (Oral)  Resp 18  Ht 5\' 6"  (1.676 m)  Wt 140 lb (63.504 kg)  BMI 22.61 kg/m2  SpO2 97% Physical Exam  Nursing note and vitals reviewed. Constitutional: He appears well-developed and well-nourished.  Pt appears well, NAD. Sitting comfortably on exam bed, watching television.  HENT:  Head: Normocephalic and atraumatic.  Eyes: Conjunctivae are normal. No scleral icterus.  Neck: Normal range of motion.  Cardiovascular: Normal rate, regular rhythm and normal heart sounds.   Pulmonary/Chest: Effort normal and breath sounds normal. No respiratory distress. He has no wheezes. He has no rales. He exhibits no tenderness.  Abdominal: Soft. Bowel sounds are normal. He exhibits no distension and no mass. There is no tenderness. There is no rebound, no guarding and no CVA tenderness.  Soft, NDNT. No CVAT.  Genitourinary: Rectum normal, prostate normal, testes normal and penis normal. Prostate is not enlarged and not tender. Right testis shows no mass, no swelling and no tenderness. Left testis shows no mass, no swelling and  no tenderness. No penile tenderness. No discharge found.  Musculoskeletal: Normal range of motion.  Neurological: He is alert.  Skin: Skin is warm and dry.    ED Course  Procedures (including critical care time) Labs Review Labs Reviewed  URINALYSIS, ROUTINE W REFLEX MICROSCOPIC - Abnormal; Notable for the following:    APPearance CLOUDY (*)    Hgb urine dipstick LARGE (*)    Leukocytes, UA LARGE (*)    All other components within normal limits  URINE MICROSCOPIC-ADD ON - Abnormal; Notable for the  following:    Squamous Epithelial / LPF FEW (*)    Bacteria, UA MANY (*)    All other components within normal limits  BASIC METABOLIC PANEL - Abnormal; Notable for the following:    Glucose, Bld 117 (*)    GFR calc non Af Amer 81 (*)    All other components within normal limits  URINE CULTURE  CBC   Imaging Review Ct Abdomen Pelvis Wo Contrast  11/05/2013   CLINICAL DATA:  Left flank pain, hematuria.  EXAM: CT ABDOMEN AND PELVIS WITHOUT CONTRAST  TECHNIQUE: Multidetector CT imaging of the abdomen and pelvis was performed following the standard protocol without intravenous contrast.  COMPARISON:  CT 12/02/2009  FINDINGS: Lung bases are clear. No effusions. Heart is normal size.  Liver, gallbladder, spleen, pancreas, adrenals and kidneys have an unremarkable unenhanced appearance. No renal or ureteral stones visualized. No hydronephrosis. Study is somewhat limited by the lack of oral and intravenous contrast and the lack of intraperitoneal fat. Stomach, large and small bowel are grossly unremarkable. Appendix is not definitively visualized, but no inflammatory process seen in the right lower quadrant. No visible free fluid, free air or adenopathy. Aorta and iliac vessels are calcified, non aneurysmal.  IMPRESSION: Limited study by the lack of intraperitoneal fat, oral and IV contrast.  No renal or ureteral stones.  No hydronephrosis.  No acute findings seen.   Electronically Signed   By: Rolm Baptise M.D.   On: 11/05/2013 18:03    EKG Interpretation   None       MDM   1. UTI (lower urinary tract infection)   2. Hematuria    Pt presenting with hematuria and mild left flank pain.  Hx of 1 new sexual partner but states he always wears condoms. Denies dysuria or penile discharge. Denies lower abdominal pain. No fever, n/v.  No hx of UTIs.    On exam: pt appears well, non-toxic, comfortable. NAD. normal genitourinary exam. Not concerning for prostatitis.    CT abd: unremarkable.   Will  discharge home with Cipro. All labs/imaging/findings discussed with patient. All questions answered and concerns addressed. Will discharge pt home and have pt f/u with [facility] and [place] provided. Return precautions given. Pt verbalized understanding and agreement with tx plan. Vitals: unremarkable. Discharged in stable condition.    Discussed pt with attending during ED encounter and agrees with plan.     Noland Fordyce, PA-C 11/05/13 2357

## 2013-11-05 NOTE — ED Notes (Signed)
Patient reports that he had dark blood in his urine last night and this AM he noted he had bright red blood in his urine. Patient c/o left flank pain that began 3-4 hours ago.

## 2013-11-06 LAB — URINE CULTURE
COLONY COUNT: NO GROWTH
Culture: NO GROWTH

## 2014-01-02 ENCOUNTER — Emergency Department (HOSPITAL_COMMUNITY)
Admission: EM | Admit: 2014-01-02 | Discharge: 2014-01-02 | Disposition: A | Discharge status: Home / Self Care | Payer: Medicaid Other | Attending: Emergency Medicine | Admitting: Emergency Medicine

## 2014-01-02 ENCOUNTER — Other Ambulatory Visit: Payer: Self-pay

## 2014-01-02 ENCOUNTER — Emergency Department (HOSPITAL_COMMUNITY): Payer: Medicaid Other

## 2014-01-02 ENCOUNTER — Encounter (HOSPITAL_COMMUNITY): Payer: Self-pay | Admitting: Emergency Medicine

## 2014-01-02 DIAGNOSIS — J45909 Unspecified asthma, uncomplicated: Secondary | ICD-10-CM | POA: Insufficient documentation

## 2014-01-02 DIAGNOSIS — M549 Dorsalgia, unspecified: Secondary | ICD-10-CM | POA: Insufficient documentation

## 2014-01-02 DIAGNOSIS — F1010 Alcohol abuse, uncomplicated: Secondary | ICD-10-CM | POA: Insufficient documentation

## 2014-01-02 DIAGNOSIS — Z792 Long term (current) use of antibiotics: Secondary | ICD-10-CM | POA: Insufficient documentation

## 2014-01-02 DIAGNOSIS — Z79899 Other long term (current) drug therapy: Secondary | ICD-10-CM | POA: Insufficient documentation

## 2014-01-02 DIAGNOSIS — M542 Cervicalgia: Secondary | ICD-10-CM | POA: Insufficient documentation

## 2014-01-02 DIAGNOSIS — F1410 Cocaine abuse, uncomplicated: Secondary | ICD-10-CM | POA: Insufficient documentation

## 2014-01-02 DIAGNOSIS — R42 Dizziness and giddiness: Secondary | ICD-10-CM | POA: Insufficient documentation

## 2014-01-02 DIAGNOSIS — R079 Chest pain, unspecified: Secondary | ICD-10-CM | POA: Insufficient documentation

## 2014-01-02 DIAGNOSIS — F17200 Nicotine dependence, unspecified, uncomplicated: Secondary | ICD-10-CM | POA: Insufficient documentation

## 2014-01-02 DIAGNOSIS — R000 Tachycardia, unspecified: Secondary | ICD-10-CM | POA: Insufficient documentation

## 2014-01-02 LAB — COMPREHENSIVE METABOLIC PANEL
ALK PHOS: 97 U/L (ref 39–117)
ALT: 25 U/L (ref 0–53)
AST: 45 U/L — ABNORMAL HIGH (ref 0–37)
Albumin: 3.5 g/dL (ref 3.5–5.2)
BILIRUBIN TOTAL: 0.3 mg/dL (ref 0.3–1.2)
BUN: 16 mg/dL (ref 6–23)
CHLORIDE: 103 meq/L (ref 96–112)
CO2: 22 meq/L (ref 19–32)
Calcium: 9.6 mg/dL (ref 8.4–10.5)
Creatinine, Ser: 1.21 mg/dL (ref 0.50–1.35)
GFR, EST AFRICAN AMERICAN: 78 mL/min — AB (ref >90)
GFR, EST NON AFRICAN AMERICAN: 67 mL/min — AB (ref >90)
GLUCOSE: 142 mg/dL — AB (ref 70–99)
POTASSIUM: 3.8 meq/L (ref 3.7–5.3)
SODIUM: 139 meq/L (ref 137–147)
TOTAL PROTEIN: 7 g/dL (ref 6.0–8.3)

## 2014-01-02 LAB — CBC WITH DIFFERENTIAL/PLATELET
Basophils Absolute: 0 10*3/uL (ref 0.0–0.1)
Basophils Relative: 0 % (ref 0–1)
Eosinophils Absolute: 0.1 10*3/uL (ref 0.0–0.7)
Eosinophils Relative: 0 % (ref 0–5)
HCT: 42.9 % (ref 39.0–52.0)
HEMOGLOBIN: 15.3 g/dL (ref 13.0–17.0)
LYMPHS ABS: 1.5 10*3/uL (ref 0.7–4.0)
LYMPHS PCT: 11 % — AB (ref 12–46)
MCH: 29.4 pg (ref 26.0–34.0)
MCHC: 35.7 g/dL (ref 30.0–36.0)
MCV: 82.5 fL (ref 78.0–100.0)
MONOS PCT: 5 % (ref 3–12)
Monocytes Absolute: 0.7 10*3/uL (ref 0.1–1.0)
NEUTROS ABS: 11.5 10*3/uL — AB (ref 1.7–7.7)
NEUTROS PCT: 84 % — AB (ref 43–77)
PLATELETS: 180 10*3/uL (ref 150–400)
RBC: 5.2 MIL/uL (ref 4.22–5.81)
RDW: 15.4 % (ref 11.5–15.5)
WBC: 13.8 10*3/uL — AB (ref 4.0–10.5)

## 2014-01-02 LAB — TROPONIN I: Troponin I: <0.3 ng/mL (ref <0.30)

## 2014-01-02 MED ORDER — LORAZEPAM 2 MG/ML IJ SOLN
1.0000 mg | Freq: Once | INTRAMUSCULAR | Status: AC
  Administered 2014-01-02: 1 mg via INTRAVENOUS
  Filled 2014-01-02: qty 1

## 2014-01-02 MED ORDER — SODIUM CHLORIDE 0.9 % IV BOLUS (SEPSIS)
1000.0000 mL | INTRAVENOUS | Status: AC
  Administered 2014-01-02: 1000 mL via INTRAVENOUS
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
## 2014-01-02 NOTE — ED Provider Notes (Signed)
CSN: [id number]     Arrival date & time 01/02/14  1840 History   First MD Initiated Contact with Patient 01/02/14 1845     Chief Complaint  Patient presents with  . Pain  . crack use-ETOH use-c/o chest pain      The history is provided by the patient.   patient presents with multiple complaints. It seems as though his most significant complaint is worsening chest pain, neck pain. This seems to become more severe with hours after he used crack. Since onset symptoms have gone progressively with no clear alleviating or exacerbating factors.  The pain is severe, sore, in the right posterior neck, left anterior chest. Patient is a mild lightheadedness, but no syncope, no vomiting, no falls. Patient has a longer history of bilateral upper arm dysesthesia, with no new weakness, no new discoordination.    Past Medical History  Diagnosis Date  . Asthma    History reviewed. No pertinent past surgical history. Family History  Problem Relation Age of Onset  . Asthma Mother   . Cancer Mother    History  Substance Use Topics  . Smoking status: Current Every Day Smoker  . Smokeless tobacco: Never Used  . Alcohol Use: 12.0 oz/week    20 Cans of beer per week     Comment: every other day- moderate intake    Review of Systems  Constitutional:       Per HPI, otherwise negative  HENT:       Per HPI, otherwise negative  Respiratory:       Per HPI, otherwise negative  Cardiovascular:       Per HPI, otherwise negative  Gastrointestinal: Negative for vomiting.  Endocrine:       Negative aside from HPI  Genitourinary:       Neg aside from HPI   Musculoskeletal: Positive for back pain and neck pain.  Skin: Negative for wound.  Neurological: Negative for syncope.      Allergies  Review of patient's allergies indicates no known allergies.  Home Medications   Current Outpatient Rx  Name  Route  Sig  Dispense  Refill  . ciprofloxacin (CIPRO) 500 MG tablet   Oral   Take 1 tablet  (500 mg total) by mouth 2 (two) times daily.   6 tablet   0   . ibuprofen (ADVIL,MOTRIN) 200 MG tablet   Oral   Take 400 mg by mouth every 6 (six) hours as needed (pain).         . Multiple Vitamins-Minerals (MULTIVITAMIN WITH MINERALS) tablet   Oral   Take 1 tablet by mouth daily.          BP 154/121  Pulse 126  Temp(Src) 98.1 F (36.7 C) (Oral)  Resp 26  SpO2 98% Physical Exam  Nursing note and vitals reviewed. Constitutional: He is oriented to person, place, and time. He appears well-developed. No distress.  HENT:  Head: Normocephalic and atraumatic.  Eyes: Conjunctivae and EOM are normal.  Cardiovascular: Regular rhythm.  Tachycardia present.   Pulmonary/Chest: Effort normal. No stridor. No respiratory distress.  Abdominal: He exhibits no distension.  Musculoskeletal: He exhibits no edema.  Neurological: He is alert and oriented to person, place, and time.  Skin: Skin is warm and dry.  Psychiatric: He has a normal mood and affect.    ED Course  Procedures (including critical care time) Labs Review Labs Reviewed  CBC WITH DIFFERENTIAL - Abnormal; Notable for the following:    WBC 13.8 (*)  Neutrophils Relative % 84 (*)    Neutro Abs 11.5 (*)    Lymphocytes Relative 11 (*)    All other components within normal limits  COMPREHENSIVE METABOLIC PANEL - Abnormal; Notable for the following:    Glucose, Bld 142 (*)    AST 45 (*)    GFR calc non Af Amer 67 (*)    GFR calc Af Amer 78 (*)    All other components within normal limits  TROPONIN I   Imaging Review Dg Chest 2 View  01/02/2014   CLINICAL DATA:  Asthma, altered mental status  EXAM: CHEST  2 VIEW  COMPARISON:  02/04/2013  FINDINGS: Normal heart size, mediastinal contours and pulmonary vascularity for technique.  Minimal streaky atelectasis in medial left lower lobe.  Lungs otherwise clear.  No pleural effusion or pneumothorax.  No acute osseous findings.  IMPRESSION: Minimal streaky atelectasis left  lower lobe.   Electronically Signed   By: Lavonia Dana M.D.   On: 01/02/2014 20:43  I reviewed the XR-  Agree with the interpretation.  EKG has a sinus tachycardia, 122, ST wave changes not substantially changed from prior abnormal Pulse oximetry 100% room air normal   I reviewed the EMR  9:32 PM Patient asleep    MDM   Patient presents with concern of chest pain following crack use. As the patient is tachycardic, uncomfortable appearing.  Improved substantially here with fluids, benzodiazepines. With patient's resolution of symptoms, he was discharged in stable condition to follow up with primary care   Carmin Muskrat, MD 01/02/14 2134

## 2014-01-02 NOTE — Discharge Instructions (Signed)
As discussed, your evaluation today has been largely reassuring.  But, it is important that you monitor your condition carefully, and do not hesitate to return to the ED if you develop new, or concerning changes in your condition.  Please do not use illicit substances.  Otherwise, please follow-up with your physician for appropriate ongoing care.

## 2014-01-17 ENCOUNTER — Emergency Department (HOSPITAL_COMMUNITY)
Admission: EM | Admit: 2014-01-17 | Discharge: 2014-01-17 | Disposition: A | Discharge status: Home / Self Care | Payer: Medicaid Other | Attending: Emergency Medicine | Admitting: Emergency Medicine

## 2014-01-17 ENCOUNTER — Encounter (HOSPITAL_COMMUNITY): Payer: Self-pay | Admitting: Emergency Medicine

## 2014-01-17 DIAGNOSIS — Z791 Long term (current) use of non-steroidal anti-inflammatories (NSAID): Secondary | ICD-10-CM | POA: Insufficient documentation

## 2014-01-17 DIAGNOSIS — F17200 Nicotine dependence, unspecified, uncomplicated: Secondary | ICD-10-CM | POA: Insufficient documentation

## 2014-01-17 DIAGNOSIS — M5412 Radiculopathy, cervical region: Secondary | ICD-10-CM | POA: Insufficient documentation

## 2014-01-17 DIAGNOSIS — J45909 Unspecified asthma, uncomplicated: Secondary | ICD-10-CM | POA: Insufficient documentation

## 2014-01-17 DIAGNOSIS — Z79899 Other long term (current) drug therapy: Secondary | ICD-10-CM | POA: Insufficient documentation

## 2014-01-17 HISTORY — DX: Reserved for concepts with insufficient information to code with codable children: IMO0002

## 2014-01-17 MED ORDER — NAPROXEN 500 MG PO TABS
500.0000 mg | ORAL_TABLET | Freq: Once | ORAL | Status: AC
  Administered 2014-01-17: 500 mg via ORAL
  Filled 2014-01-17: qty 1

## 2014-01-17 MED ORDER — METHOCARBAMOL 500 MG PO TABS: 1000.0000 mg | ORAL_TABLET | Freq: Four times a day (QID) | ORAL | Status: DC

## 2014-01-17 MED ORDER — OXYCODONE HCL 5 MG PO CAPS: 5.0000 mg | ORAL_CAPSULE | ORAL | Status: DC | PRN

## 2014-01-17 MED ORDER — HYDROCODONE-ACETAMINOPHEN 5-325 MG PO TABS
1.0000 | ORAL_TABLET | Freq: Once | ORAL | Status: AC
  Administered 2014-01-17: 1 via ORAL
  Filled 2014-01-17: qty 1

## 2014-01-17 MED ORDER — PREDNISONE 20 MG PO TABS: ORAL_TABLET | ORAL | Status: DC

## 2014-01-17 MED ORDER — NAPROXEN 500 MG PO TABS: 500.0000 mg | ORAL_TABLET | Freq: Two times a day (BID) | ORAL | Status: DC

## 2014-01-17 NOTE — ED Provider Notes (Signed)
Medical screening examination/treatment/procedure(s) were performed by non-physician practitioner and as supervising physician I was immediately available for consultation/collaboration.  Richarda Blade, MD 01/17/14 [phone]

## 2014-01-17 NOTE — ED Provider Notes (Signed)
CSN: [id number]     Arrival date & time 01/17/14  0857 History   First MD Initiated Contact with Patient 01/17/14 [phone]     Chief Complaint  Patient presents with  . Back Pain     (Consider location/radiation/quality/duration/timing/severity/associated sxs/prior Treatment) HPI Comments: Patient with history of cervical radiculopathy presents with a flare of his usual radicular-type pain for the past one week. Patient states that the pain radiates from his neck and shoulder blade down into his right arm. Patient states that he has decreased sensation in his right hand. He denies new weakness or complete numbness. Patient has been using naproxen at home which helps somewhat. Patient denies lower extremity symptoms and all red flag signs and symptoms of lower back pain. No fevers. He has been placed on steroids in the past which have helped. He states that his bulging discs at C2-3 and C5-6. No new injuries or trauma. The onset of this condition was acute. The course is constant. Aggravating factors: Certain positions. Alleviating factors: none.    Patient is a 53 y.o. male presenting with back pain. The history is provided by the patient and medical records.  Back Pain Associated symptoms: numbness (decreased sensation, no complete numbness)   Associated symptoms: no fever and no weakness     Past Medical History  Diagnosis Date  . Asthma   . DDD (degenerative disc disease)    History reviewed. No pertinent past surgical history. Family History  Problem Relation Age of Onset  . Asthma Mother   . Cancer Mother    History  Substance Use Topics  . Smoking status: Current Every Day Smoker  . Smokeless tobacco: Never Used  . Alcohol Use: 12.0 oz/week    20 Cans of beer per week     Comment: every other day- moderate intake    Review of Systems  Constitutional: Negative for fever and unexpected weight change.  Gastrointestinal: Negative for constipation.       Neg for fecal  incontinence  Genitourinary: Negative for hematuria, flank pain and difficulty urinating.       Negative for urinary incontinence or retention  Musculoskeletal: Positive for back pain and myalgias.  Neurological: Positive for numbness (decreased sensation, no complete numbness). Negative for weakness.       Negative for saddle paresthesias       Allergies  Review of patient's allergies indicates no known allergies.  Home Medications   Current Outpatient Rx  Name  Route  Sig  Dispense  Refill  . methocarbamol (ROBAXIN) 500 MG tablet   Oral   Take 2 tablets (1,000 mg total) by mouth 4 (four) times daily.   20 tablet   0   . naproxen (NAPROSYN) 500 MG tablet   Oral   Take 1 tablet (500 mg total) by mouth 2 (two) times daily.   20 tablet   0   . naproxen sodium (ANAPROX) 220 MG tablet   Oral   Take 220 mg by mouth daily as needed (for pain).         Marland Kitchen oxycodone (OXY-IR) 5 MG capsule   Oral   Take 1 capsule (5 mg total) by mouth every 4 (four) hours as needed.   10 capsule   0   . predniSONE (DELTASONE) 20 MG tablet      3 Tabs PO Days 1-3, then 2 tabs PO Days 4-6, then 1 tab PO Day 7-9, then Half Tab PO Day 10-12   20 tablet  0    BP 146/90  Pulse 92  Temp(Src) 97.8 F (36.6 C) (Oral)  Resp 18  SpO2 99% Physical Exam  Nursing note and vitals reviewed. Constitutional: He appears well-developed and well-nourished.  HENT:  Head: Normocephalic and atraumatic.  Eyes: Conjunctivae are normal.  Neck: Normal range of motion.  Abdominal: Soft. There is no tenderness. There is no CVA tenderness.  Musculoskeletal: Normal range of motion. He exhibits tenderness.       Right shoulder: He exhibits tenderness. He exhibits normal range of motion.       Left shoulder: Normal.       Right elbow: Normal.      Right wrist: Normal.       Cervical back: He exhibits tenderness. He exhibits normal range of motion and no bony tenderness.       Back:       Right upper arm:  Normal.       Right forearm: Normal.       Right hand: He exhibits normal range of motion. Decreased sensation noted. Decreased sensation is present in the ulnar distribution, is present in the medial distribution and is present in the radial distribution. Normal strength noted.  No step-off noted with palpation of spine.   Neurological: He is alert. He has normal reflexes. A sensory deficit (decreased sensation entire hand) is present. He exhibits normal muscle tone.  5/5 strength in entire lower extremities bilaterally. No sensation deficit. 2+ patellar reflexes.   Skin: Skin is warm and dry.  Psychiatric: He has a normal mood and affect.    ED Course  Procedures (including critical care time) Labs Review Labs Reviewed - No data to display Imaging Review No results found.   EKG Interpretation None      9:49 AM Patient seen and examined. Medications ordered.   Vital signs reviewed and are as follows: Filed Vitals:   01/17/14 0942  BP: 128/91  Pulse: 86  Temp: 97.9 F (36.6 C)  Resp: 18    No red flag s/s of back pain. Patient was counseled on radicular precautions and told to do activity as tolerated but do not lift, push, or pull heavy objects more than 10 pounds for the next week.  Patient counseled to use ice or heat on back for no longer than 15 minutes every hour.   Patient prescribed muscle relaxer and counseled on proper use of muscle relaxant medication.    Patient prescribed narcotic pain medicine and counseled on proper use of narcotic pain medications. Counseled not to combine this medication with others containing tylenol.   Urged patient not to drink alcohol, drive, or perform any other activities that requires focus while taking either of these medications.  Patient urged to follow-up with PCP if pain does not improve with treatment and rest or if pain becomes recurrent. Urged to return with worsening severe pain, loss of bowel or bladder control, trouble  walking.   The patient verbalizes understanding and agrees with the plan.   MDM   Final diagnoses:  Cervical radiculopathy   Patient with typical cervical radiculopathy, no lower extremity symptoms to suggest central cord etiology. This is the patient's usual pain. Will attempt symptomatic control and have urged patient to followup with his primary care physician for further evaluation or return if worsening. Instructions reviewed with patient he agrees with the plan.    Carlisle Cater, PA-C 01/17/14 [phone]

## 2014-01-17 NOTE — Progress Notes (Signed)
[facility] CL spoke with patient about [facility] [employer]. Patient PCP on card is [facility]. Patient stated that he was going to [facility] for primary care. [place] at [facility] to schedule patient a follow-up apt and was told that he was not patient with them, had not been there since 2012. Family Medicine at [facility] told CL that patient would have to go back to [facility] and they would refer patient to them. CL explained this to patient who stated that he was only using the pharmacy at [facility] [place]. Patient stated that he would go back to [facility] for follow-up.

## 2014-01-17 NOTE — ED Notes (Signed)
Pt states he has degenerative disc dz and c/o right back pain under shoulder blade. Pt states right hands also feels numb.

## 2014-01-17 NOTE — Discharge Instructions (Signed)
Please read and follow all provided instructions.  Your diagnoses today include:  1. Cervical radiculopathy     Tests performed today include:  Vital signs - see below for your results today  Medications prescribed:   Oxycodone - narcotic pain medication  DO NOT drive or perform any activities that require you to be awake and alert because this medicine can make you drowsy.    Robaxin (methocarbamol) - muscle relaxer medication  DO NOT drive or perform any activities that require you to be awake and alert because this medicine can make you drowsy.    Naproxen - anti-inflammatory pain medication  Do not exceed 500mg  naproxen every 12 hours, take with food  You have been prescribed an anti-inflammatory medication or NSAID. Take with food. Take smallest effective dose for the shortest duration needed for your pain. Stop taking if you experience stomach pain or vomiting.    Prednisone - steroid medicine   It is best to take this medication in the morning to prevent sleeping problems. If you are diabetic, monitor your blood sugar closely and stop taking Prednisone if blood sugar is over 300. Take with food to prevent stomach upset.   Take any prescribed medications only as directed.  Home care instructions:   Follow any educational materials contained in this packet  Please rest, use ice or heat on your back for the next several days  Do not lift, push, pull anything more than 10 pounds for the next week  Follow-up instructions: Please follow-up with your primary care provider in the next 1 week for further evaluation of your symptoms. If you do not have a primary care doctor -- see below for referral information.   Return instructions:  SEEK IMMEDIATE MEDICAL ATTENTION IF YOU HAVE:  New numbness, tingling, weakness, or problem with the use of your arms or legs  Severe back pain not relieved with medications  Loss control of your bowels or bladder  Increasing pain in  any areas of the body (such as chest or abdominal pain)  Shortness of breath, dizziness, or fainting.   Worsening nausea (feeling sick to your stomach), vomiting, fever, or sweats  Any other emergent concerns regarding your health   Additional Information:  Your vital signs today were: BP 146/90   Pulse 92   Temp(Src) 97.8 F (36.6 C) (Oral)   Resp 18   SpO2 99% If your blood pressure (BP) was elevated above 135/85 this visit, please have this repeated by your doctor within one month. --------------  Emergency Department Resource Guide 1) Find a Doctor and Pay Out of Pocket Although you won't have to find out who is covered by your insurance plan, it is a good idea to ask around and get recommendations. You will then need to call the office and see if the doctor you have chosen will accept you as a new patient and what types of options they offer for patients who are self-pay. Some doctors offer discounts or will set up payment plans for their patients who do not have insurance, but you will need to ask so you aren't surprised when you get to your appointment.  2) Contact Your Local Health Department Not all health departments have doctors that can see patients for sick visits, but many do, so it is worth a call to see if yours does. If you don't know where your local health department is, you can check in your phone book. The CDC also has a tool to help you  locate your state's health department, and many state websites also have listings of all of their local health departments.  3) Find a [facility] If your illness is not likely to be very severe or complicated, you may want to try a walk in clinic. These are popping up all over the country in pharmacies, drugstores, and shopping centers. They're usually staffed by nurse practitioners or physician assistants that have been trained to treat common illnesses and complaints. They're usually fairly quick and inexpensive. However, if you  have serious medical issues or chronic medical problems, these are probably not your best option.  No Primary Care Doctor: - Call Health Connect at  [phone] - they can help you locate a primary care doctor that  accepts your insurance, provides certain services, etc. - Physician Referral Service- [phone]  Chronic Pain Problems: Organization         Address  Phone   Notes  [facility]  [phone] Patients need to be referred by their primary care doctor.   Medication Assistance: Organization         Address  Phone   Notes  [facility] Medication [facility] [address] [place], [place] [address] [phone] --Must be a resident of [facility] -- Must have NO insurance coverage whatsoever (no Medicaid/ Medicare, etc.) -- The pt. MUST have a primary care doctor that directs their care regularly and follows them in the community   MedAssist  [phone]   [employer]  [phone]    Agencies that provide inexpensive medical care: Organization         Address  Phone   Notes  [place]  [phone]   [facility]    [phone]   [facility] [place], [place] [phone]   [place] [address] [place] [phone]   Planned Parenthood    [phone]   [facility]    [phone]   [place] and [address], [place] Phone:  [phone], Fax:  [phone] Hours of Operation:  9 am - 6 pm, M-F.  Also accepts Medicaid/Medicare and self-pay.  [facility] for [place], [address], [place] Phone: [phone], Fax: [phone]. Hours of Operation:  8:30 am - 5:30 pm, M-F.  Also accepts Medicaid and self-pay.  [facility] [place] [address] [place] Phone: [phone]   [place], [place], [place] [phone], Ext. [phone] Mondays & Thursdays: 7-9 AM.  First 15 patients are seen on a first come, first serve basis.    [place] Providers:  Organization         Address  Phone   Notes  [facility] [address], [place] [phone] Also accepts self-pay patients.  [facility] [address], [place]  [phone]   [place], [address], [place] [phone]   [facility] [address] [place] [phone]   Lucianne Lei [address], [place]   [phone] Only accepts [place] Access [place] patients after they have their name applied to their card.   Self-Pay (no insurance) in [facility]  Organization  Address  Phone   Notes  Sickle Cell Patients, [facility] [place] [phone]   [facility] [phone]   [facility] [place]  [address], [address], [place] [phone]   [facility]/Dr. Osei-Bonsu  [address] [place] or [address], [place] [phone] Phone number for both [place] and [place] locations is the same.  Urgent Medical and [facility] [address], [place] [phone]   [facility] [address], [place] or [address] [phone]   [facility] [address], [place] [phone], phone; [phone], fax Sees patients 1st and 3rd Saturday of every month.  Must not qualify for public or private insurance (i.e. Medicaid, Medicare, [place] Health Choice, Veterans' Benefits)  Household income should be no more than 200% of the poverty level The clinic cannot treat you if you are pregnant or think you are pregnant   Sexually transmitted diseases are not treated at the clinic.    Dental Care: Organization         Address  Phone  Notes  [facility] Department of [facility] [phone] Accepts children up to age 57 who are enrolled in [place] or [place]; pregnant women with a Medicaid card; and children who have applied for Medicaid or [place] Health Choice, but were declined, whose parents can pay a reduced fee at time of service.  [facility] Department of [facility]  [address], [place] [phone] Accepts children up to age 70 who are enrolled in [place] or [place]; pregnant women with a Medicaid card; and children who have applied for Medicaid or [place] Health Choice, but were declined, whose parents can pay a reduced fee at time of service.  [place] Adult Dental Access PROGRAM  [place] [phone] Patients are seen by appointment only. Walk-ins are not accepted. [place] will see patients 34 years of age and older. Monday - Tuesday (8am-5pm) Most Wednesdays (8:30-5pm) $30 per visit, cash only  [facility] Adult Dental Access PROGRAM  [address], [facility] [phone] Patients are seen by appointment only. Walk-ins are not accepted. [place] will see patients 19 years of age and older. One Wednesday Evening (Monthly: Volunteer Based).  $30 per visit, cash only  [place]  [phone] for adults; Children under age 68, call Graduate Pediatric Dentistry at [phone]. Children aged 22-14, please call [phone] to request a pediatric application.  Dental services are provided in all areas of dental care including fillings, crowns and bridges, complete and partial dentures, implants, gum treatment, root canals, and extractions. Preventive care is also provided. Treatment is provided to both adults and children. Patients  are selected via a lottery and there is often a waiting list.   [facility] [address] [place]  [phone] [email or web address]   [facility] [address] [place], [place] [phone], Ext. [phone] Second and Fourth Thursday of each month, opens at 6:30 AM; Clinic ends at 9 AM.  Patients are seen on a first-come first-served basis, and a limited number are seen during each clinic.   [facility]  [address] [place], [place]  [place], [place] [phone]   Eligibility Requirements You must have lived in [place], [place], or [place] counties for at least the last three months.   You cannot be eligible for state or federal sponsored [employer], including [employer], [place], or [employer].   You generally cannot be eligible for healthcare insurance through your employer.    How to apply: Eligibility screenings are held every Tuesday and Wednesday afternoon from 1:00 pm until 4:00 pm. You do not need an appointment for the interview!  [facility] [address] [place], [place]   [place]  [place] Department  [place]  [phone]    Behavioral Health Resources in the Community: Intensive Outpatient Programs Organization         Address  Phone  Notes  Bastrop Overton. [address], [place], [place] [phone]   [facility] Outpatient [address] [place], [place]   ADS: Alcohol & Drug Svcs [address], [place], [place]   [place] [address],  [place], [place] or [phone]   Substance Abuse Resources Organization         Address  Phone  Notes  Alcohol and Drug Services  [phone]   [place]  [phone]   [facility] [place]   Chinita Pester  [phone]    Residential & Outpatient Substance Abuse Program  [phone]   Psychological Services Organization         Address  Phone  Notes  [facility]  [place]  [phone]   [place] [address] [place] or [phone]    Mobile Crisis Teams Organization         Address  Phone  Notes  [facility]atives, Mobile Crisis Care Unit  [phone]   [facility]ive Psychotherapeutic Services  [address] [place], [place]   [place] Levels [address] [place] [phone]    Self-Help/Support Groups Organization         Address  Phone             Notes  [place]. of [place] - variety of support groups  [place] Call for more information  Narcotics Anonymous (NA), [facility] [address], [employer] [place]  2 meetings at this location   Special educational needs teacher         Address  Phone  Notes  ASAP Residential Treatment [place],    [place]  [phone]   [facility]  [address] [place] [address], [place], [place]   Quentin Reynolds, [place] [phone] Admissions: 8am-3pm M-F  Incentives Substance Short [address],    [place], [place] [phone]   [facility] [address] Jadene Pierini [place], [place]   [facility] [address],  [place], [place]   Insight Programs - Intensive Outpatient [address] Krist[address], [place], [place]   [facility] [place].,  [place], [place] or [phone]   [facility]idential Treatment Services (RTS) [address] [place], [place] Accepts Medicaid  [facility] [place] [address]  [place] [phone] Substance Abuse/Addiction Treatment   [facility]  Resources Organization  Address  Phone  Notes  [facility]  [phone]   Domenic Schwab, PhD [address] Arlis Porta [place], [place]   [phone] or [phone]   [place], [place] [phone]   [address], [place], [place] [phone] Insurance/Medicaid/sponsorship through [facility] [place] and Families [address]                                    [place], [place] [phone] [place] [address], [place] [phone]    Dr. Adele Schilder  [phone]   [facility] [place] Dept. 1) [address] [place] 2) [place] 3)  [address], [place] [phone]  [phone]   [place] [phone] or [phone] (After Hours)

## 2014-05-01 ENCOUNTER — Emergency Department (HOSPITAL_COMMUNITY)
Admission: EM | Admit: 2014-05-01 | Discharge: 2014-05-01 | Disposition: A | Discharge status: Home / Self Care | Payer: Medicaid Other | Attending: Emergency Medicine | Admitting: Emergency Medicine

## 2014-05-01 ENCOUNTER — Encounter (HOSPITAL_COMMUNITY): Payer: Self-pay | Admitting: Emergency Medicine

## 2014-05-01 DIAGNOSIS — F17200 Nicotine dependence, unspecified, uncomplicated: Secondary | ICD-10-CM | POA: Diagnosis not present

## 2014-05-01 DIAGNOSIS — Z8739 Personal history of other diseases of the musculoskeletal system and connective tissue: Secondary | ICD-10-CM | POA: Insufficient documentation

## 2014-05-01 DIAGNOSIS — J45909 Unspecified asthma, uncomplicated: Secondary | ICD-10-CM | POA: Insufficient documentation

## 2014-05-01 DIAGNOSIS — M5412 Radiculopathy, cervical region: Secondary | ICD-10-CM | POA: Diagnosis not present

## 2014-05-01 DIAGNOSIS — M542 Cervicalgia: Secondary | ICD-10-CM | POA: Diagnosis present

## 2014-05-01 DIAGNOSIS — G8929 Other chronic pain: Secondary | ICD-10-CM | POA: Insufficient documentation

## 2014-05-01 DIAGNOSIS — Z79899 Other long term (current) drug therapy: Secondary | ICD-10-CM | POA: Insufficient documentation

## 2014-05-01 MED ORDER — OXYCODONE-ACETAMINOPHEN 5-325 MG PO TABS: 1.0000 | ORAL_TABLET | Freq: Four times a day (QID) | ORAL | Status: DC | PRN

## 2014-05-01 MED ORDER — NAPROXEN 500 MG PO TABS: 500.0000 mg | ORAL_TABLET | Freq: Two times a day (BID) | ORAL | Status: DC | PRN

## 2014-05-01 MED ORDER — METHOCARBAMOL 500 MG PO TABS: 500.0000 mg | ORAL_TABLET | Freq: Three times a day (TID) | ORAL | Status: DC | PRN

## 2014-05-01 MED ORDER — PREDNISONE 20 MG PO TABS: ORAL_TABLET | ORAL | Status: DC

## 2014-05-01 NOTE — Discharge Instructions (Signed)
Your chronic neck and back pain needs to be evaluated by a chronic pain specialist. You are being given naprosyn and percocet for pain and inflammation, as well as prednisone for inflammation. Take these as directed. Robaxin is a muscle relaxer which will help with pain and spasms. Do not drive with these medications. See the pain clinic for continued care. Return to the emergency department for any changes or worsening symptoms.   Cervical Radiculopathy Cervical radiculopathy means a nerve in the neck is pinched or bruised. This can cause pain or loss of feeling (numbness) that runs from your neck to your arm and fingers. HOME CARE   Put ice on the injured or painful area.  Put ice in a plastic bag.  Place a towel between your skin and the bag.  Leave the ice on for 15-20 minutes, 03-04 times a day, or as told by your doctor.  If ice does not help, you can try using heat. Take a warm shower or bath, or use a hot water bottle as told by your doctor.  You may try a gentle neck and shoulder massage.  Use a flat pillow when you sleep.  Only take medicines as told by your doctor.  Keep all physical therapy visits as told by your doctor.  If you are given a soft collar, wear it as told by your doctor. GET HELP RIGHT AWAY IF:   Your pain gets worse and is not controlled with medicine.  You lose feeling or feel weak in your hand, arm, face, or leg.  You have a fever or stiff neck.  You cannot control when you poop or pee (incontinence).  You have trouble with walking, balance, or speaking. MAKE SURE YOU:   Understand these instructions.  Will watch your condition.  Will get help right away if you are not doing well or get worse. Document Released: 09/23/2011 Document Revised: 12/27/2011 Document Reviewed: 09/23/2011 [facility] Patient Information 2015 [place], [place]. This information is not intended to replace advice given to you by your health care provider. Make sure you  discuss any questions you have with your health care provider.  Chronic Pain Chronic pain can be defined as pain that is off and on and lasts for 3-6 months or longer. Many things cause chronic pain, which can make it difficult to make a diagnosis. There are many treatment options available for chronic pain. However, finding a treatment that works well for you may require trying various approaches until the right one is found. Many people benefit from a combination of two or more types of treatment to control their pain. SYMPTOMS  Chronic pain can occur anywhere in the body and can range from mild to very severe. Some types of chronic pain include:  Headache.  Low back pain.  Cancer pain.  Arthritis pain.  Neurogenic pain. This is pain resulting from damage to nerves. People with chronic pain may also have other symptoms such as:  Depression.  Anger.  Insomnia.  Anxiety. DIAGNOSIS  Your health care provider will help diagnose your condition over time. In many cases, the initial focus will be on excluding possible conditions that could be causing the pain. Depending on your symptoms, your health care provider may order tests to diagnose your condition. Some of these tests may include:   Blood tests.   CT scan.   MRI.   X-rays.   Ultrasounds.   Nerve conduction studies.  You may need to see a specialist.  TREATMENT  Finding  treatment that works well may take time. You may be referred to a pain specialist. He or she may prescribe medicine or therapies, such as:   Mindful meditation or yoga.  Shots (injections) of numbing or pain-relieving medicines into the spine or area of pain.  Local electrical stimulation.  Acupuncture.   Massage therapy.   Aroma, color, light, or sound therapy.   Biofeedback.   Working with a physical therapist to keep from getting stiff.   Regular, gentle exercise.   Cognitive or behavioral therapy.   Group support.   Sometimes, surgery may be recommended.  HOME CARE INSTRUCTIONS   Take all medicines as directed by your health care provider.   Lessen stress in your life by relaxing and doing things such as listening to calming music.   Exercise or be active as directed by your health care provider.   Eat a healthy diet and include things such as vegetables, fruits, fish, and lean meats in your diet.   Keep all follow-up appointments with your health care provider.   Attend a support group with others suffering from chronic pain. SEEK MEDICAL CARE IF:   Your pain gets worse.   You develop a new pain that was not there before.   You cannot tolerate medicines given to you by your health care provider.   You have new symptoms since your last visit with your health care provider.  SEEK IMMEDIATE MEDICAL CARE IF:   You feel weak.   You have decreased sensation or numbness.   You lose control of bowel or bladder function.   Your pain suddenly gets much worse.   You develop shaking.  You develop chills.  You develop confusion.  You develop chest pain.  You develop shortness of breath.  MAKE SURE YOU:  Understand these instructions.  Will watch your condition.  Will get help right away if you are not doing well or get worse. Document Released: 06/26/2002 Document Revised: 06/06/2013 Document Reviewed: 03/30/2013 [facility] Patient Information 2015 [place], [place]. This information is not intended to replace advice given to you by your health care provider. Make sure you discuss any questions you have with your health care provider.  Emergency Department Resource Guide 1) Find a Doctor and Pay Out of Pocket Although you won't have to find out who is covered by your insurance plan, it is a good idea to ask around and get recommendations. You will then need to call the office and see if the doctor you have chosen will accept you as a new patient and what types of options  they offer for patients who are self-pay. Some doctors offer discounts or will set up payment plans for their patients who do not have insurance, but you will need to ask so you aren't surprised when you get to your appointment.  2) Contact Your Local Health Department Not all health departments have doctors that can see patients for sick visits, but many do, so it is worth a call to see if yours does. If you don't know where your local health department is, you can check in your phone book. The CDC also has a tool to help you locate your state's health department, and many state websites also have listings of all of their local health departments.  3) Find a [facility] If your illness is not likely to be very severe or complicated, you may want to try a walk in clinic. These are popping up all over the country in pharmacies,  drugstores, and shopping centers. They're usually staffed by nurse practitioners or physician assistants that have been trained to treat common illnesses and complaints. They're usually fairly quick and inexpensive. However, if you have serious medical issues or chronic medical problems, these are probably not your best option.  No Primary Care Doctor: - Call Health Connect at  [phone] - they can help you locate a primary care doctor that  accepts your insurance, provides certain services, etc. - Physician Referral Service- [phone]  Chronic Pain Problems: Organization         Address  Phone   Notes  [facility]  [phone] Patients need to be referred by their primary care doctor.   Medication Assistance: Organization         Address  Phone   Notes  [facility] Medication [facility] [address] [place], [place] [address] [phone] --Must be a resident of [facility] -- Must have NO insurance coverage whatsoever (no Medicaid/ Medicare, etc.) -- The pt. MUST have a primary care doctor that directs their  care regularly and follows them in the community   MedAssist  [phone]   [employer]  [phone]    Agencies that provide inexpensive medical care: Organization         Address  Phone   Notes  [place]  [phone]   [facility]    [phone]   [facility] [place], [place] [address] [phone]   [place] [address], [place] [phone]   Planned Parenthood    [phone]   [facility]    [phone]   [place] and [address], [place] Phone:  [phone], Fax:  [phone] Hours of Operation:  9 am - 6 pm, M-F.  Also accepts Medicaid/Medicare and self-pay.  [facility] for [place], [address], [place] Phone: [phone], Fax: [phone]. Hours of Operation:  8:30 am - 5:30 pm, M-F.  Also accepts Medicaid and self-pay.  [facility] [place] [address], [place] Phone: [phone]   [place], [place], [place] [phone], Ext. [phone] Mondays & Thursdays: 7-9 AM.  First 15 patients are seen on a first come, first serve basis.    [place] Providers:  Organization         Address  Phone   Notes  [facility] [address], [place] [phone] Also accepts self-pay patients.  [facility] [address], [place]  [phone]   [place], [address], [place] [phone]   [facility] [address], [place] [phone]   Lucianne Lei [address], [place]   [phone] Only accepts [place] Access [place] patients after they have their name applied to their card.   Self-Pay (no insurance) in [facility]:  Organization         Address  Phone    Notes  Sickle Cell Patients, [facility] [place] [phone]   [facility] [place] [phone]   [facility]gent [place]  [place], [place], [place] [phone]   [facility]/Dr. Osei-Bonsu  [address], [place] or [address], [place] [phone] Phone number for both [place] and [place] locations is the same.  Urgent Medical and [facility] [address] [place] [phone]   [facility] [address], [place] or [address] [phone]   [facility] [address] [place] [phone], phone; [phone], fax Sees patients 1st and 3rd Saturday of every month.  Must not qualify for public or private insurance (i.e. Medicaid, Medicare, [place] Health Choice, Veterans' Benefits)  Household income should be no more than 200% of the poverty level The clinic cannot treat you if you are pregnant or think you are pregnant  Sexually transmitted diseases are not treated at the clinic.    Dental Care: Organization         Address  Phone  Notes  [facility] [place] [phone] Accepts children up to age 28 who are enrolled in [place] or [place]; pregnant women with a Medicaid card; and children who have applied for Medicaid or [place] Health Choice, but were declined, whose parents can pay a reduced fee at time of service.  [facility] Department of [facility]  [address], [place] [phone] Accepts children up to age 84 who are enrolled in [place] or [place]; pregnant women with a Medicaid card; and children who have applied for Medicaid or  Health Choice, but were declined, whose parents can pay a reduced fee at time of service.  [place]  Adult Dental Access PROGRAM  [place] [phone] Patients are seen by appointment only. Walk-ins are not accepted. [place] will see patients 71 years of age and older. Monday - Tuesday (8am-5pm) Most Wednesdays (8:30-5pm) $30 per visit, cash only  [facility] Adult Dental Access PROGRAM  [address], [facility] [phone] Patients are seen by appointment only. Walk-ins are not accepted. [place] will see patients 45 years of age and older. One Wednesday Evening (Monthly: Volunteer Based).  $30 per visit, cash only  [place]  [phone] for adults; Children under age 45, call Graduate Pediatric Dentistry at [phone]. Children aged 83-14, please call [phone] to request a pediatric application.  Dental services are provided in all areas of dental care including fillings, crowns and bridges, complete and partial dentures, implants, gum treatment, root canals, and extractions. Preventive care is also provided. Treatment is provided to both adults and children. Patients are selected via a lottery and there is often a waiting list.   [facility] [address] [place]  [phone] [email or web address]   [facility] [address] [place], [place] [phone], Ext. [phone] Second and Fourth Thursday of each month, opens at 6:30 AM; Clinic ends at 9 AM.  Patients are seen on a first-come first-served basis, and a limited number are seen during each clinic.   [facility]  [address] [place]ard Danker [place], [place] [phone]   Eligibility Requirements You must have lived in [place], [place], or [place] counties for at least the last three months.   You cannot be eligible for state or federal sponsored [employer], including [employer], [place], or [employer].   You generally cannot be eligible  for healthcare insurance through your employer.    How to  apply: Eligibility screenings are held every Tuesday and Wednesday afternoon from 1:00 pm until 4:00 pm. You do not need an appointment for the interview!  [facility] [address] [place], [place]   [place]  [place] Department  [place]  [phone]    Behavioral Health Resources in the Community: Intensive Outpatient Programs Organization         Address  Phone  Notes  [place]. [address], [place], [place] [phone]   [facility] Outpatient [address] [place], [place]   ADS: Alcohol & Drug Svcs [address] [place], [place]   [place] [address] [place] [address] [place],  [place], [place] or [phone]   Substance Abuse Resources Organization         Address  Phone  Notes  Alcohol and Drug Services  [phone]   [place]  [phone]   [facility] [place]   Chinita Pester  [phone]   Residential & Outpatient Substance Abuse Program  [phone]   Psychological Services Organization         Address  Phone  Notes  [facility]  [place]  [phone]   [place] [address] [place] or [phone]    Mobile Crisis Teams Organization         Address  Phone  Notes  [facility], Mobile Crisis Care Unit  [phone]   [facility]  [address] [place], [place]   [place] Levels [address] [place] [phone]    Self-Help/Support Groups Organization         Address  Phone             Notes  [place]. of [place] - variety of support groups  [place] Call for more information  Narcotics Anonymous (NA), [facility]aring [facility] [address], [employer] [place]  2 meetings at this location   Special educational needs teacher         Address  Phone  Notes  [facility] Residential Treatment [place],    [place]  [phone]   [facility]  [address] [place], [place] [address], [place], [place]   [place], [place] [phone] Admissions: 8am-3pm M-F  Incentives Substance [place] [address],    [place], [place] [phone]   [facility] [address] [place], [place], [place]   [facility] [address]  [place], [place]   [facility]ight Programs - Intensive Outpatient [address], [place], [place]   [facility] ([place].,  [place], [place] [phone] or [phone]   [facility]idential Treatment Services (RTS) [address], [place], [place] Accepts Medicaid  [facility] [place] [address],  [place] [phone] Substance Abuse/Addiction Treatment   [facility] Organization         Address  Phone  Notes  [facility]  [phone]   Domenic Schwab, PhD [address] [place], [place]   [phone] or [phone]   [place],  [place] [phone]   [facility] [address], [place], [place] [phone] Insurance/Medicaid/sponsorship through [employer] and Families [address], [address] [place], [place] [phone] [place] [address]   [place], [place] [phone]    Dr. Adele Schilder  [phone]   [facility] Dept. 1) [address] [place] 2) [place] 3)  [place] [address], [place] [phone]  [phone]   [place]  [phone] or [phone] (After Hours)

## 2014-05-01 NOTE — ED Notes (Signed)
Pt reports neck and upper back pain for several days that is getting worse. Denies any injury to back, has hx of same due to degenerative disc disease. Pt ambulatory at triage.

## 2014-05-01 NOTE — ED Provider Notes (Signed)
CSN: [id number]     Arrival date & time 05/01/14  1649 History  This chart was scribed for [employer], PA-C working with Alfonzo Feller, DO by Randa Evens, ED Scribe. This patient was seen in room TR09C/TR09C and the patient's care was started at 5:34 PM.     Chief Complaint  Patient presents with  . Neck Pain  . Back Pain   HPI Comments: Steven [name] is a 53 y.o. 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 He states the pain radiates to his left arm, which is always where his neck pain radiates to. He also states that movement worsens his pain and has no known alleviating factors. He denies any recent injury to the the neck or back. He states the last time he had similar symptoms he received pain medication and muscle relaxer's from [facility] ED that provided relief, but that he hasn't tried anything today. He denies urine/ bowel incontinence, drooling, numbness, weakness, paresthesias, fevers/chills, chest pain, or SOB.  Patient is a 53 y.o. male presenting with neck pain and back pain. The history is provided by the patient. No language interpreter was used.  Neck Pain Pain location:  Generalized neck Quality:  Aching and stabbing Pain radiates to:  L arm Pain severity:  Severe Pain is:  Same all the time Onset quality:  Gradual Duration:  2 days Timing:  Constant Progression:  Unchanged Chronicity:  Chronic Context: not lifting a heavy object and not recent injury   Relieved by:  Nothing Worsened by:  Position and bending Ineffective treatments:  None tried Associated symptoms: no bladder incontinence, no bowel incontinence, no chest pain, no fever, no headaches, no numbness, no paresis, no tingling and no weakness   Back  Pain Associated symptoms: no abdominal pain, no bladder incontinence, no bowel incontinence, no chest pain, no fever, no headaches, no numbness, no tingling and no weakness      Past Medical History  Diagnosis Date  . Asthma   . DDD (degenerative disc disease)    History reviewed. No pertinent past surgical history. Family History  Problem Relation Age of Onset  . Asthma Mother   . Cancer Mother    History  Substance Use Topics  . Smoking status: Current Every Day Smoker  . Smokeless tobacco: Never Used  . Alcohol Use: 12.0 oz/week    20 Cans of beer per week     Comment: every other day- moderate intake    Review of Systems  Constitutional: Negative for fever and chills.  HENT: Negative for drooling and trouble swallowing.   Respiratory: Negative for chest tightness and shortness of breath.   Cardiovascular: Negative for chest pain.  Gastrointestinal: Negative for nausea, vomiting, abdominal pain and bowel incontinence.  Genitourinary: Negative for bladder incontinence.  Musculoskeletal: Positive for back pain and neck pain. Negative for arthralgias, joint swelling, myalgias and neck stiffness.  Skin: Negative for color change.  Neurological: Negative for dizziness, tingling, syncope, weakness, numbness and headaches.  Psychiatric/Behavioral: Negative for confusion.  10 Systems reviewed and are negative for acute change except as noted in the HPI.   Allergies  Review of patient's allergies indicates no known allergies.  Home Medications   Prior to Admission medications   Medication Sig  Start Date End Date Taking? Authorizing Provider  methocarbamol (ROBAXIN) 500 MG tablet Take 2 tablets (1,000 mg total) by mouth 4 (four) times daily. 01/17/14   Carlisle Cater, PA-C  methocarbamol (ROBAXIN) 500 MG tablet Take 1 tablet (500 mg total) by mouth every 8 (eight) hours as needed for muscle spasms. 05/01/14   Sanjna Haskew Strupp Camprubi-Soms, PA-C  naproxen (NAPROSYN) 500 MG tablet  Take 1 tablet (500 mg total) by mouth 2 (two) times daily. 01/17/14   Carlisle Cater, PA-C  naproxen (NAPROSYN) 500 MG tablet Take 1 tablet (500 mg total) by mouth 2 (two) times daily as needed for mild pain, moderate pain or headache (TAKE WITH MEALS.). 05/01/14   Krista Som Strupp Camprubi-Soms, PA-C  naproxen sodium (ANAPROX) 220 MG tablet Take 220 mg by mouth daily as needed (for pain).    Historical Provider, MD  oxycodone (OXY-IR) 5 MG capsule Take 1 capsule (5 mg total) by mouth every 4 (four) hours as needed. 01/17/14   Carlisle Cater, PA-C  oxyCODONE-acetaminophen (PERCOCET) 5-325 MG per tablet Take 1-2 tablets by mouth every 6 (six) hours as needed for severe pain. 05/01/14   Fernandez Kenley Strupp Camprubi-Soms, PA-C  predniSONE (DELTASONE) 20 MG tablet 3 Tabs PO Days 1-3, then 2 tabs PO Days 4-6, then 1 tab PO Day 7-9, then Half Tab PO Day 10-12 01/17/14   Carlisle Cater, PA-C  predniSONE (DELTASONE) 20 MG tablet 3 tabs po daily x 4 days 05/01/14   Patty Sermons Camprubi-Soms, PA-C   Triage Vitals: BP 114/77  Pulse 69  Temp(Src) 98.7 F (37.1 C) (Oral)  Resp 17  Ht 5' 6.5" (1.689 m)  Wt 135 lb (61.236 kg)  BMI 21.47 kg/m2  SpO2 97%  Physical Exam  Nursing note and vitals reviewed. Constitutional: He is oriented to person, place, and time. Vital signs are normal. He appears well-developed and well-nourished. No distress.  VSS, NAD  HENT:  Head: Normocephalic and atraumatic.  Mouth/Throat: Mucous membranes are normal.  Eyes: Conjunctivae and EOM are normal. Pupils are equal, round, and reactive to light.  Neck: Normal range of motion. Neck supple. Muscular tenderness present. No spinous process tenderness present. No rigidity. Normal range of motion present.    FROM intact, no spinous process or midline TTP or bony deformity, paracervical muscle TTP and mild spasm noted to L side.  Cardiovascular: Normal rate, regular rhythm, normal heart sounds and intact distal pulses.   No murmur  heard. Pulmonary/Chest: Effort normal. No respiratory distress.  Abdominal: Normal appearance. He exhibits no distension.  Musculoskeletal: Normal range of motion.       Right shoulder: Normal.       Left shoulder: Normal.       Cervical back: He exhibits pain and spasm. He exhibits normal range of motion.       Thoracic back: Normal.       Lumbar back: Normal.  Cspine as above. Lumbar and thoracic spinal levels non-TTP over midline spinous processes or paraspinous muscles. No spasms noted.  B/L shoulders ROM intact, with no TTP or deformity. Strength 5/5 in all extremities. Cap refill <3 sec. Good distal pulses.   Neurological: He is alert and oriented to person, place, and time. He has normal strength. No sensory deficit.  Strength 5/5 in all extremities, sensation grossly intact over all extremities.   Skin: Skin is warm, dry and intact. No rash noted.  Psychiatric: He has a normal mood and affect. His behavior is normal.    ED Course  Procedures (  including critical care time) DIAGNOSTIC STUDIES: Oxygen Saturation is 97% on RA, normal by my interpretation.    COORDINATION OF CARE: 5:49 PM-Discussed treatment plan which includes pain medication with pt at bedside and pt agreed to plan.     Labs Review Labs Reviewed - No data to display  Imaging Review No results found.   EKG Interpretation None      MDM   Final diagnoses:  Chronic radicular cervical pain   GEOVANNI RAHMING is a 53 y.o. male with a PMHx of DDD of cervical spine, presenting with ongoing chronic pain. Exam benign for acute change, neurovascularly intact with good strength and sensation.  No recent trauma or falls, no change in his chronic pain. No red flag s/s of low back pain. No s/s of central cord compression or cauda equina. Upper and Lower extremities are neurovascularly intact and patient is ambulating without difficulty. Do not feel imaging is required at this time, due to chronicity of pain and lack  of neuro deficits. Doubt ACS/PE or any other source of his pain, I believe this is chronic pain that has been under treated. States he has never seen a chronic pain clinic. Rx given for muscle relaxer and counseled on proper use of muscle relaxant medication. Rx given for narcotic pain medicine and counseled on proper use of narcotic pain medications. Counseled not to combine this medication with others containing tylenol. Discussed use of heat for relief. Discussed prednisone use for alleviation of radicular symptoms. Discussed f/up with pain clinic. I explained the diagnosis and have given explicit precautions to return to the ER including for any other new or worsening symptoms. The patient understands and accepts the medical plan as it's been dictated and I have answered their questions. Discharge instructions concerning home care and prescriptions have been given. The patient is STABLE and is discharged to home in good condition.   I personally performed the services described in this documentation, which was scribed in my presence. The recorded information has been reviewed and is accurate.  BP 114/77  Pulse 69  Temp(Src) 98.7 F (37.1 C) (Oral)  Resp 17  Ht 5' 6.5" (1.689 m)  Wt 135 lb (61.236 kg)  BMI 21.47 kg/m2  SpO2 97%   [employer], PA-C 05/02/14 [id number]

## 2014-05-02 NOTE — ED Provider Notes (Signed)
Medical screening examination/treatment/procedure(s) were performed by non-physician practitioner and as supervising physician I was immediately available for consultation/collaboration.   EKG Interpretation None        Alfonzo Feller, DO 05/02/14 1433

## 2015-03-08 ENCOUNTER — Encounter (HOSPITAL_COMMUNITY): Payer: Self-pay | Admitting: Emergency Medicine

## 2015-03-08 ENCOUNTER — Emergency Department (HOSPITAL_COMMUNITY)
Admission: EM | Admit: 2015-03-08 | Discharge: 2015-03-08 | Disposition: A | Discharge status: Home / Self Care | Payer: Medicaid Other | Attending: Emergency Medicine | Admitting: Emergency Medicine

## 2015-03-08 DIAGNOSIS — Z7952 Long term (current) use of systemic steroids: Secondary | ICD-10-CM | POA: Insufficient documentation

## 2015-03-08 DIAGNOSIS — Z79899 Other long term (current) drug therapy: Secondary | ICD-10-CM | POA: Insufficient documentation

## 2015-03-08 DIAGNOSIS — M546 Pain in thoracic spine: Secondary | ICD-10-CM | POA: Diagnosis not present

## 2015-03-08 DIAGNOSIS — J45909 Unspecified asthma, uncomplicated: Secondary | ICD-10-CM | POA: Insufficient documentation

## 2015-03-08 DIAGNOSIS — Z720 Tobacco use: Secondary | ICD-10-CM | POA: Diagnosis not present

## 2015-03-08 DIAGNOSIS — Z791 Long term (current) use of non-steroidal anti-inflammatories (NSAID): Secondary | ICD-10-CM | POA: Insufficient documentation

## 2015-03-08 MED ORDER — CYCLOBENZAPRINE HCL 10 MG PO TABS: 10.0000 mg | ORAL_TABLET | Freq: Two times a day (BID) | ORAL | Status: DC | PRN

## 2015-03-08 MED ORDER — IBUPROFEN 800 MG PO TABS: 800.0000 mg | ORAL_TABLET | Freq: Three times a day (TID) | ORAL | Status: DC

## 2015-03-08 MED ORDER — OXYCODONE-ACETAMINOPHEN 5-325 MG PO TABS
1.0000 | ORAL_TABLET | Freq: Once | ORAL | Status: AC
  Administered 2015-03-08: 1 via ORAL
  Filled 2015-03-08: qty 1

## 2015-03-08 MED ORDER — OXYCODONE-ACETAMINOPHEN 5-325 MG PO TABS: 1.0000 | ORAL_TABLET | ORAL | Status: DC | PRN

## 2015-03-08 MED ORDER — IBUPROFEN 800 MG PO TABS
800.0000 mg | ORAL_TABLET | Freq: Once | ORAL | Status: AC
  Administered 2015-03-08: 800 mg via ORAL
  Filled 2015-03-08: qty 1

## 2015-03-08 NOTE — ED Notes (Signed)
Pt's contact:  Fernanda Drum (daughter)---- tel# [phone]

## 2015-03-08 NOTE — Discharge Instructions (Signed)
Back Pain, Adult Low back pain is very common. About 1 in 5 people have back pain.The cause of low back pain is rarely dangerous. The pain often gets better over time.About half of people with a sudden onset of back pain feel better in just 2 weeks. About 8 in 10 people feel better by 6 weeks.  CAUSES Some common causes of back pain include:  Strain of the muscles or ligaments supporting the spine.  Wear and tear (degeneration) of the spinal discs.  Arthritis.  Direct injury to the back. DIAGNOSIS Most of the time, the direct cause of low back pain is not known.However, back pain can be treated effectively even when the exact cause of the pain is unknown.Answering your caregiver's questions about your overall health and symptoms is one of the most accurate ways to make sure the cause of your pain is not dangerous. If your caregiver needs more information, he or she may order lab work or imaging tests (X-rays or MRIs).However, even if imaging tests show changes in your back, this usually does not require surgery. HOME CARE INSTRUCTIONS For many people, back pain returns.Since low back pain is rarely dangerous, it is often a condition that people can learn to manageon their own.   Remain active. It is stressful on the back to sit or stand in one place. Do not sit, drive, or stand in one place for more than 30 minutes at a time. Take short walks on level surfaces as soon as pain allows.Try to increase the length of time you walk each day.  Do not stay in bed.Resting more than 1 or 2 days can delay your recovery.  Do not avoid exercise or work.Your body is made to move.It is not dangerous to be active, even though your back may hurt.Your back will likely heal faster if you return to being active before your pain is gone.  Pay attention to your body when you bend and lift. Many people have less discomfortwhen lifting if they bend their knees, keep the load close to their bodies,and  avoid twisting. Often, the most comfortable positions are those that put less stress on your recovering back.  Find a comfortable position to sleep. Use a firm mattress and lie on your side with your knees slightly bent. If you lie on your back, put a pillow under your knees.  Only take over-the-counter or prescription medicines as directed by your caregiver. Over-the-counter medicines to reduce pain and inflammation are often the most helpful.Your caregiver may prescribe muscle relaxant drugs.These medicines help dull your pain so you can more quickly return to your normal activities and healthy exercise.  Put ice on the injured area.  Put ice in a plastic bag.  Place a towel between your skin and the bag.  Leave the ice on for 15-20 minutes, 03-04 times a day for the first 2 to 3 days. After that, ice and heat may be alternated to reduce pain and spasms.  Ask your caregiver about trying back exercises and gentle massage. This may be of some benefit.  Avoid feeling anxious or stressed.Stress increases muscle tension and can worsen back pain.It is important to recognize when you are anxious or stressed and learn ways to manage it.Exercise is a great option. SEEK MEDICAL CARE IF:  You have pain that is not relieved with rest or medicine.  You have pain that does not improve in 1 week.  You have new symptoms.  You are generally not feeling well. SEEK   IMMEDIATE MEDICAL CARE IF:   You have pain that radiates from your back into your legs.  You develop new bowel or bladder control problems.  You have unusual weakness or numbness in your arms or legs.  You develop nausea or vomiting.  You develop abdominal pain.  You feel faint. Document Released: 10/04/2005 Document Revised: 04/04/2012 Document Reviewed: 02/05/2014 ExitCare Patient Information 2015 [employer]. This information is not intended to replace advice given to you by your health care provider. Make sure you  discuss any questions you have with your health care provider.  

## 2015-03-08 NOTE — ED Provider Notes (Signed)
CSN: [id number]     Arrival date & time 03/08/15  3785 History   First MD Initiated Contact with Patient 03/08/15 0434     Chief Complaint  Patient presents with  . Back Pain     (Consider location/radiation/quality/duration/timing/severity/associated sxs/prior Treatment) Patient is a 54 y.o. male presenting with back pain. The history is provided by the patient. No language interpreter was used.  Back Pain Location:  Thoracic spine Quality:  Aching Duration:  8 hours Chronicity:  Recurrent Associated symptoms: no fever   Associated symptoms comment:  Recurrent back pain, "degenerative disc disease", that started earlier this evening. No injury. No chest pain, cough or SOB. No nausea, vomiting or abdominal pain.   Past Medical History  Diagnosis Date  . Asthma   . DDD (degenerative disc disease)    History reviewed. No pertinent past surgical history. Family History  Problem Relation Age of Onset  . Asthma Mother   . Cancer Mother    History  Substance Use Topics  . Smoking status: Current Every Day Smoker  . Smokeless tobacco: Never Used  . Alcohol Use: 12.0 oz/week    20 Cans of beer per week     Comment: every other day- moderate intake    Review of Systems  Constitutional: Negative for fever and chills.  HENT: Negative.   Respiratory: Negative.   Cardiovascular: Negative.   Gastrointestinal: Negative.   Musculoskeletal: Positive for back pain.       See HPI  Skin: Negative.   Neurological: Negative.       Allergies  Review of patient's allergies indicates no known allergies.  Home Medications   Prior to Admission medications   Medication Sig Start Date End Date Taking? Authorizing Provider  methocarbamol (ROBAXIN) 500 MG tablet Take 2 tablets (1,000 mg total) by mouth 4 (four) times daily. Patient not taking: Reported on 03/08/2015 01/17/14   Carlisle Cater, PA-C  methocarbamol (ROBAXIN) 500 MG tablet Take 1 tablet (500 mg total) by mouth every 8 (eight)  hours as needed for muscle spasms. Patient not taking: Reported on 03/08/2015 05/01/14   Mercedes Camprubi-Soms, PA-C  naproxen (NAPROSYN) 500 MG tablet Take 1 tablet (500 mg total) by mouth 2 (two) times daily. Patient not taking: Reported on 03/08/2015 01/17/14   Carlisle Cater, PA-C  naproxen (NAPROSYN) 500 MG tablet Take 1 tablet (500 mg total) by mouth 2 (two) times daily as needed for mild pain, moderate pain or headache (TAKE WITH MEALS.). Patient not taking: Reported on 03/08/2015 05/01/14   Mercedes Camprubi-Soms, PA-C  naproxen sodium (ANAPROX) 220 MG tablet Take 220 mg by mouth daily as needed (for pain).    Historical Provider, MD  oxycodone (OXY-IR) 5 MG capsule Take 1 capsule (5 mg total) by mouth every 4 (four) hours as needed. Patient not taking: Reported on 03/08/2015 01/17/14   Carlisle Cater, PA-C  oxyCODONE-acetaminophen (PERCOCET) 5-325 MG per tablet Take 1-2 tablets by mouth every 6 (six) hours as needed for severe pain. Patient not taking: Reported on 03/08/2015 05/01/14   Mercedes Camprubi-Soms, PA-C  predniSONE (DELTASONE) 20 MG tablet 3 Tabs PO Days 1-3, then 2 tabs PO Days 4-6, then 1 tab PO Day 7-9, then Half Tab PO Day 10-12 Patient not taking: Reported on 03/08/2015 01/17/14   Carlisle Cater, PA-C  predniSONE (DELTASONE) 20 MG tablet 3 tabs po daily x 4 days Patient not taking: Reported on 03/08/2015 05/01/14   Mercedes Camprubi-Soms, PA-C   BP 130/78 mmHg  Pulse 82  Temp(Src)  97.8 F (36.6 C) (Oral)  Resp 16  SpO2 96% Physical Exam  Constitutional: He appears well-developed and well-nourished.  HENT:  Head: Normocephalic.  Neck: Normal range of motion. Neck supple.  Cardiovascular: Normal rate and regular rhythm.   Pulmonary/Chest: Effort normal and breath sounds normal.  Abdominal: Soft. Bowel sounds are normal. There is no tenderness. There is no rebound and no guarding.  Musculoskeletal: Normal range of motion.  No swelling or discoloration of the back. Tender to length  of left parathoracic spine. FROM UE's.  Neurological: He is alert.  Skin: Skin is warm and dry. No rash noted.  Psychiatric: He has a normal mood and affect.    ED Course  Procedures (including critical care time) Labs Review Labs Reviewed - No data to display  Imaging Review No results found.   EKG Interpretation None      MDM   Final diagnoses:  None    1. Recurrent back pain  Uncomplicated back pain in stable patient.     Charlann Lange, PA-C 03/08/15 [id number]  Everlene Balls, MD 03/08/15 1534

## 2015-03-08 NOTE — ED Notes (Signed)
Bed: WA24 Expected date:  Expected time:  Means of arrival:  Comments: EMS 54 yo male from [facility]/back/muscle spasms

## 2015-03-08 NOTE — ED Notes (Signed)
Brought in by EMS from [facility] with c/o upper back pain.  Pt reports he started having upper back pain at around 2330.   Pt has chronic back pain d/t "degenerative disk" disease.  Pt was not actually in jail but "lobbying" in front of the center--- facility staff called EMS on behalf of pt.

## 2015-04-23 ENCOUNTER — Encounter (HOSPITAL_COMMUNITY): Payer: Self-pay | Admitting: Cardiology

## 2015-04-23 ENCOUNTER — Emergency Department (HOSPITAL_COMMUNITY)
Admission: EM | Admit: 2015-04-23 | Discharge: 2015-04-23 | Disposition: A | Discharge status: Home / Self Care | Payer: Medicaid Other | Attending: Emergency Medicine | Admitting: Emergency Medicine

## 2015-04-23 DIAGNOSIS — M79604 Pain in right leg: Secondary | ICD-10-CM | POA: Diagnosis not present

## 2015-04-23 DIAGNOSIS — R531 Weakness: Secondary | ICD-10-CM | POA: Diagnosis not present

## 2015-04-23 DIAGNOSIS — J45909 Unspecified asthma, uncomplicated: Secondary | ICD-10-CM | POA: Diagnosis not present

## 2015-04-23 DIAGNOSIS — M546 Pain in thoracic spine: Secondary | ICD-10-CM | POA: Diagnosis present

## 2015-04-23 DIAGNOSIS — Z720 Tobacco use: Secondary | ICD-10-CM | POA: Insufficient documentation

## 2015-04-23 DIAGNOSIS — M549 Dorsalgia, unspecified: Secondary | ICD-10-CM | POA: Insufficient documentation

## 2015-04-23 DIAGNOSIS — Z791 Long term (current) use of non-steroidal anti-inflammatories (NSAID): Secondary | ICD-10-CM | POA: Insufficient documentation

## 2015-04-23 DIAGNOSIS — R200 Anesthesia of skin: Secondary | ICD-10-CM | POA: Insufficient documentation

## 2015-04-23 DIAGNOSIS — G8929 Other chronic pain: Secondary | ICD-10-CM | POA: Diagnosis not present

## 2015-04-23 MED ORDER — HYDROCODONE-ACETAMINOPHEN 5-325 MG PO TABS
2.0000 | ORAL_TABLET | Freq: Once | ORAL | Status: AC
  Administered 2015-04-23: 2 via ORAL
  Filled 2015-04-23: qty 2

## 2015-04-23 MED ORDER — CYCLOBENZAPRINE HCL 10 MG PO TABS: 10.0000 mg | ORAL_TABLET | Freq: Two times a day (BID) | ORAL | Status: DC | PRN

## 2015-04-23 MED ORDER — IBUPROFEN 800 MG PO TABS: 800.0000 mg | ORAL_TABLET | Freq: Three times a day (TID) | ORAL | Status: DC

## 2015-04-23 NOTE — ED Notes (Signed)
Declined W/C at D/C and was escorted to lobby by RN. 

## 2015-04-23 NOTE — Discharge Instructions (Signed)
Followup with orthopedics if symptoms continue. Use conservative methods at home including heat therapy and cold therapy as we discussed. More information on cold therapy is listed below.  It is not recommended to use heat treatment directly after an acute injury.  Take muscle relaxer (Flexeril) as prescribed.  Do not drive or operate heavy machinery for 4-6 hours after taking muscle relaxer.  SEEK IMMEDIATE MEDICAL ATTENTION IF: New numbness, tingling, weakness, or problem with the use of your arms or legs.  Severe back pain not relieved with medications.  Change in bowel or bladder control.  Increasing pain in any areas of the body (such as chest or abdominal pain).  Shortness of breath, dizziness or fainting.  Nausea (feeling sick to your stomach), vomiting, fever, or sweats.  COLD THERAPY DIRECTIONS:  Ice or gel packs can be used to reduce both pain and swelling. Ice is the most helpful within the first 24 to 48 hours after an injury or flareup from overusing a muscle or joint.  Ice is effective, has very few side effects, and is safe for most people to use.   If you expose your skin to cold temperatures for too long or without the proper protection, you can damage your skin or nerves. Watch for signs of skin damage due to cold.   HOME CARE INSTRUCTIONS  Follow these tips to use ice and cold packs safely.  Place a dry or damp towel between the ice and skin. A damp towel will cool the skin more quickly, so you may need to shorten the time that the ice is used.  For a more rapid response, add gentle compression to the ice.  Ice for no more than 10 to 20 minutes at a time. The bonier the area you are icing, the less time it will take to get the benefits of ice.  Check your skin after 5 minutes to make sure there are no signs of a poor response to cold or skin damage.  Rest 20 minutes or more in between uses.  Once your skin is numb, you can end your treatment. You can test numbness by very  lightly touching your skin. The touch should be so light that you do not see the skin dimple from the pressure of your fingertip. When using ice, most people will feel these normal sensations in this order: cold, burning, aching, and numbness.  Do not use ice on someone who cannot communicate their responses to pain, such as small children or people with dementia.   HOW TO MAKE AN ICE PACK  To make an ice pack, do one of the following:  Place crushed ice or a bag of frozen vegetables in a sealable plastic bag. Squeeze out the excess air. Place this bag inside another plastic bag. Slide the bag into a pillowcase or place a damp towel between your skin and the bag.  Mix 3 parts water with 1 part rubbing alcohol. Freeze the mixture in a sealable plastic bag. When you remove the mixture from the freezer, it will be slushy. Squeeze out the excess air. Place this bag inside another plastic bag. Slide the bag into a pillowcase or place a damp towel between your skin and the bag.   SEEK MEDICAL CARE IF:  You develop white spots on your skin. This may give the skin a blotchy (mottled) appearance.  Your skin turns blue or pale.  Your skin becomes waxy or hard.  Your swelling gets worse.  MAKE SURE YOU:  Understand these instructions.  Will watch your condition.  Will get help right away if you are not doing well or get worse.    Chronic Pain Discharge Instructions  Emergency care providers appreciate that many patients coming to [place] are in severe pain and we wish to address their pain in the safest, most responsible manner.  It is important to recognize however, that the proper treatment of chronic pain differs from that of the pain of injuries and acute illnesses.  Our goal is to provide quality, safe, personalized care and we thank you for giving [place] the opportunity to serve you. The use of narcotics and related agents for chronic pain syndromes may lead to additional physical and psychological problems.   Nearly as many people die from prescription narcotics each year as die from car crashes.  Additionally, this risk is increased if such prescriptions are obtained from a variety of sources.  Therefore, only your primary care physician or a pain management specialist is able to safely treat such syndromes with narcotic medications long-term.    Documentation revealing such prescriptions have been sought from multiple sources may prohibit [place] from providing a refill or different narcotic medication.  Your name may be checked first through the [place].  This database is a record of controlled substance medication prescriptions that the patient has received.  This has been established by [facility] in an effort to eliminate the dangerous, and often life threatening, practice of obtaining multiple prescriptions from different medical providers.   If you have a chronic pain syndrome (i.e. chronic headaches, recurrent back or neck pain, dental pain, abdominal or pelvis pain without a specific diagnosis, or neuropathic pain such as fibromyalgia) or recurrent visits for the same condition without an acute diagnosis, you may be treated with non-narcotics and other non-addictive medicines.  Allergic reactions or negative side effects that may be reported by a patient to such medications will not typically lead to the use of a narcotic analgesic or other controlled substance as an alternative.   Patients managing chronic pain with a personal physician should have provisions in place for breakthrough pain.  If you are in crisis, you should call your physician.  If your physician directs you to the emergency department, please have the doctor call and speak to our attending physician concerning your care.   When patients come to the Emergency Department (ED) with acute medical conditions in which the Emergency Department physician feels appropriate to prescribe narcotic or  sedating pain medication, the physician will prescribe these in very limited quantities.  The amount of these medications will last only until you can see your primary care physician in his/her office.  Any patient who returns to the ED seeking refills should expect only non-narcotic pain medications.   In the event of an acute medical condition exists and the emergency physician feels it is necessary that the patient be given a narcotic or sedating medication -  a responsible adult driver should be present in the room prior to the medication being given by the nurse.   Prescriptions for narcotic or sedating medications that have been lost, stolen or expired will not be refilled in the Emergency Department.    Patients who have chronic pain may receive non-narcotic prescriptions until seen by their primary care physician.  It is every patients personal responsibility to maintain active prescriptions with his or her primary care physician or specialist.

## 2015-04-23 NOTE — ED Notes (Signed)
Pt reports lower back pain that radiates down into the left leg and hip. Reports a hx of degenerative disc disease.

## 2015-04-23 NOTE — ED Provider Notes (Signed)
CSN: [id number]     Arrival date & time 04/23/15  1415 History  This chart was scribed for non-physician practitioner, Hyman Bible, PA-C working with Veryl Speak, MD by Rayna Sexton, ED scribe. This patient was seen in room TR07C/TR07C and the patient's care was started at 3:30 PM.   Chief Complaint  Patient presents with  . Back Pain  . Leg Pain   The history is provided by the patient. No language interpreter was used.    HPI Comments: Steven [name] is a 54 y.o. male, with a history of DDD, who presents to the Emergency Department complaining of constant, moderate, chronic upper back pain with onset 9 years ago and recent worsening symptoms. He notes a radiation of pain to his right shoulder and down his right leg with a worsening of his pain when sitting or lying down for a long period of time. He further notes associated numbness and tingling of the right leg. He notes taking 800 mg ibuprofen and flexeril with a relief of his symptoms for a short period of time and notes currently being out of both prescriptions. Pt denies any recent trauma, history of CA or IVDA. He denies any incontinence of his bowels or bladder, saddle anaesthesia, fever, or chills.   Past Medical History  Diagnosis Date  . Asthma   . DDD (degenerative disc disease)    History reviewed. No pertinent past surgical history. Family History  Problem Relation Age of Onset  . Asthma Mother   . Cancer Mother    History  Substance Use Topics  . Smoking status: Current Every Day Smoker  . Smokeless tobacco: Never Used  . Alcohol Use: 12.0 oz/week    20 Cans of beer per week     Comment: every other day- moderate intake    Review of Systems  Constitutional: Negative for fever and chills.  Gastrointestinal:       Denies incontinence of bladder  Genitourinary:       Denies incontinence of bowels  Musculoskeletal: Positive for myalgias and arthralgias.  Neurological: Positive for weakness and numbness.    Allergies  Review of patient's allergies indicates no known allergies.  Home Medications   Prior to Admission medications   Medication Sig Start Date End Date Taking? Authorizing Provider  cyclobenzaprine (FLEXERIL) 10 MG tablet Take 1 tablet (10 mg total) by mouth 2 (two) times daily as needed for muscle spasms. 03/08/15   Charlann Lange, PA-C  ibuprofen (ADVIL,MOTRIN) 800 MG tablet Take 1 tablet (800 mg total) by mouth 3 (three) times daily. 03/08/15   Charlann Lange, PA-C  methocarbamol (ROBAXIN) 500 MG tablet Take 2 tablets (1,000 mg total) by mouth 4 (four) times daily. Patient not taking: Reported on 03/08/2015 01/17/14   Carlisle Cater, PA-C  methocarbamol (ROBAXIN) 500 MG tablet Take 1 tablet (500 mg total) by mouth every 8 (eight) hours as needed for muscle spasms. Patient not taking: Reported on 03/08/2015 05/01/14   Mercedes Camprubi-Soms, PA-C  naproxen (NAPROSYN) 500 MG tablet Take 1 tablet (500 mg total) by mouth 2 (two) times daily. Patient not taking: Reported on 03/08/2015 01/17/14   Carlisle Cater, PA-C  naproxen (NAPROSYN) 500 MG tablet Take 1 tablet (500 mg total) by mouth 2 (two) times daily as needed for mild pain, moderate pain or headache (TAKE WITH MEALS.). Patient not taking: Reported on 03/08/2015 05/01/14   Mercedes Camprubi-Soms, PA-C  naproxen sodium (ANAPROX) 220 MG tablet Take 220 mg by mouth daily as needed (for pain).  Historical Provider, MD  oxycodone (OXY-IR) 5 MG capsule Take 1 capsule (5 mg total) by mouth every 4 (four) hours as needed. Patient not taking: Reported on 03/08/2015 01/17/14   Carlisle Cater, PA-C  oxyCODONE-acetaminophen (PERCOCET/ROXICET) 5-325 MG per tablet Take 1-2 tablets by mouth every 4 (four) hours as needed for severe pain. 03/08/15   Charlann Lange, PA-C  predniSONE (DELTASONE) 20 MG tablet 3 Tabs PO Days 1-3, then 2 tabs PO Days 4-6, then 1 tab PO Day 7-9, then Half Tab PO Day 10-12 Patient not taking: Reported on 03/08/2015 01/17/14   Carlisle Cater,  PA-C  predniSONE (DELTASONE) 20 MG tablet 3 tabs po daily x 4 days Patient not taking: Reported on 03/08/2015 05/01/14   Mercedes Camprubi-Soms, PA-C   BP 135/94 mmHg  Pulse 65  Temp(Src) 97.6 F (36.4 C) (Oral)  Resp 20  SpO2 98% Physical Exam  Constitutional: He is oriented to person, place, and time. He appears well-developed and well-nourished. No distress.  HENT:  Head: Normocephalic and atraumatic.  Mouth/Throat: Oropharynx is clear and moist.  Eyes: Conjunctivae and EOM are normal. Pupils are equal, round, and reactive to light.  Neck: Normal range of motion. Neck supple. No tracheal deviation present.  Cardiovascular: Normal rate, regular rhythm and intact distal pulses.   2+ radial pulses bilaterally; 2+ DP pulses bilaterally  Pulmonary/Chest: Effort normal and breath sounds normal. No respiratory distress.  Abdominal: Soft.  Musculoskeletal: Normal range of motion. He exhibits tenderness.  TTP of T-spine without step offs or deformities; no tenderness of L or C-spine; grip strength 5/5 bilaterally  Neurological: He is alert and oriented to person, place, and time. He has normal reflexes.  Reflex Scores:      Patellar reflexes are 2+ on the right side and 2+ on the left side. Sensation intact; sensation of right foot slightly decreased but intact; full sensation of the left foot;   Skin: Skin is warm and dry.  Psychiatric: He has a normal mood and affect. His behavior is normal.  Nursing note and vitals reviewed.   ED Course  Procedures  DIAGNOSTIC STUDIES: Oxygen Saturation is 98% on RA, normal by my interpretation.    COORDINATION OF CARE: 3:37 PM Discussed treatment plan with pt at and pt agreed to plan.  Labs Review Labs Reviewed - No data to display  Imaging Review No results found.   EKG Interpretation None      MDM   Final diagnoses:  None  Patient with back pain.  No neurological deficits and normal neuro exam.  Patient can walk but states is  painful.  No loss of bowel or bladder control.  No concern for cauda equina.  No fever, night sweats, weight loss, h/o cancer, IVDU.  RICE protocol and pain medicine indicated and discussed with patient.  Return precautions given.  I personally performed the services described in this documentation, which was scribed in my presence. The recorded information has been reviewed and is accurate.    Hyman Bible, PA-C 04/23/15 [place], MD 04/24/15 [phone]

## 2015-09-10 ENCOUNTER — Emergency Department (HOSPITAL_COMMUNITY)
Admission: EM | Admit: 2015-09-10 | Discharge: 2015-09-10 | Disposition: A | Discharge status: Home / Self Care | Payer: Medicaid Other | Attending: Emergency Medicine | Admitting: Emergency Medicine

## 2015-09-10 ENCOUNTER — Encounter (HOSPITAL_COMMUNITY): Payer: Self-pay | Admitting: Emergency Medicine

## 2015-09-10 DIAGNOSIS — J45909 Unspecified asthma, uncomplicated: Secondary | ICD-10-CM | POA: Insufficient documentation

## 2015-09-10 DIAGNOSIS — Z791 Long term (current) use of non-steroidal anti-inflammatories (NSAID): Secondary | ICD-10-CM | POA: Insufficient documentation

## 2015-09-10 DIAGNOSIS — N485 Ulcer of penis: Secondary | ICD-10-CM | POA: Diagnosis not present

## 2015-09-10 DIAGNOSIS — Z8739 Personal history of other diseases of the musculoskeletal system and connective tissue: Secondary | ICD-10-CM | POA: Insufficient documentation

## 2015-09-10 DIAGNOSIS — F17200 Nicotine dependence, unspecified, uncomplicated: Secondary | ICD-10-CM | POA: Diagnosis not present

## 2015-09-10 DIAGNOSIS — R1030 Lower abdominal pain, unspecified: Secondary | ICD-10-CM | POA: Diagnosis present

## 2015-09-10 MED ORDER — ACETAMINOPHEN 500 MG PO TABS
1000.0000 mg | ORAL_TABLET | Freq: Once | ORAL | Status: AC
  Administered 2015-09-10: 1000 mg via ORAL
  Filled 2015-09-10: qty 2

## 2015-09-10 MED ORDER — ACETAMINOPHEN 325 MG PO TABS: 325.0000 mg | ORAL_TABLET | Freq: Once | ORAL | Status: DC

## 2015-09-10 MED ORDER — PENICILLIN G BENZATHINE 1200000 UNIT/2ML IM SUSP
2.4000 10*6.[IU] | Freq: Once | INTRAMUSCULAR | Status: AC
  Administered 2015-09-10: 2.4 10*6.[IU] via INTRAMUSCULAR
  Filled 2015-09-10: qty 4
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
## 2015-09-11 ENCOUNTER — Emergency Department (HOSPITAL_COMMUNITY)
Admission: EM | Admit: 2015-09-11 | Discharge: 2015-09-11 | Disposition: A | Discharge status: Home / Self Care | Payer: Medicaid Other | Attending: Emergency Medicine | Admitting: Emergency Medicine

## 2015-09-11 ENCOUNTER — Encounter (HOSPITAL_COMMUNITY): Payer: Self-pay | Admitting: Emergency Medicine

## 2015-09-11 DIAGNOSIS — N390 Urinary tract infection, site not specified: Secondary | ICD-10-CM | POA: Diagnosis not present

## 2015-09-11 DIAGNOSIS — F17200 Nicotine dependence, unspecified, uncomplicated: Secondary | ICD-10-CM | POA: Diagnosis not present

## 2015-09-11 DIAGNOSIS — Z791 Long term (current) use of non-steroidal anti-inflammatories (NSAID): Secondary | ICD-10-CM | POA: Insufficient documentation

## 2015-09-11 DIAGNOSIS — Z8739 Personal history of other diseases of the musculoskeletal system and connective tissue: Secondary | ICD-10-CM | POA: Insufficient documentation

## 2015-09-11 DIAGNOSIS — J45909 Unspecified asthma, uncomplicated: Secondary | ICD-10-CM | POA: Diagnosis not present

## 2015-09-11 DIAGNOSIS — R109 Unspecified abdominal pain: Secondary | ICD-10-CM | POA: Diagnosis present

## 2015-09-11 DIAGNOSIS — B20 Human immunodeficiency virus [HIV] disease: Secondary | ICD-10-CM | POA: Diagnosis not present

## 2015-09-11 HISTORY — DX: Syphilis, unspecified: A53.9

## 2015-09-11 LAB — URINALYSIS, ROUTINE W REFLEX MICROSCOPIC
BILIRUBIN URINE: NEGATIVE
Glucose, UA: NEGATIVE mg/dL
Ketones, ur: NEGATIVE mg/dL
Nitrite: NEGATIVE
Protein, ur: NEGATIVE mg/dL
SPECIFIC GRAVITY, URINE: 1.029 (ref 1.005–1.030)
pH: 5.5 (ref 5.0–8.0)

## 2015-09-11 LAB — URINE MICROSCOPIC-ADD ON

## 2015-09-11 LAB — RPR: RPR: NONREACTIVE

## 2015-09-11 MED ORDER — CEPHALEXIN 500 MG PO CAPS: 500.0000 mg | ORAL_CAPSULE | Freq: Four times a day (QID) | ORAL | Status: DC

## 2015-09-11 MED ORDER — CEFTRIAXONE SODIUM 1 G IJ SOLR
1.0000 g | Freq: Once | INTRAMUSCULAR | Status: AC
  Administered 2015-09-11: 1 g via INTRAMUSCULAR
  Filled 2015-09-11: qty 10

## 2015-09-11 MED ORDER — PROMETHAZINE HCL 25 MG PO TABS: 25.0000 mg | ORAL_TABLET | Freq: Four times a day (QID) | ORAL | Status: DC | PRN

## 2015-09-11 MED ORDER — STERILE WATER FOR INJECTION IJ SOLN
INTRAMUSCULAR | Status: AC
  Administered 2015-09-11: 10 mL
  Filled 2015-09-11: qty 10

## 2015-09-11 MED ORDER — KETOROLAC TROMETHAMINE 60 MG/2ML IM SOLN
60.0000 mg | Freq: Once | INTRAMUSCULAR | Status: AC
  Administered 2015-09-11: 60 mg via INTRAMUSCULAR
  Filled 2015-09-11: qty 2

## 2015-09-11 MED ORDER — DIAZEPAM 5 MG PO TABS
5.0000 mg | ORAL_TABLET | Freq: Once | ORAL | Status: AC
  Administered 2015-09-11: 5 mg via ORAL
  Filled 2015-09-11: qty 1

## 2015-09-11 MED ORDER — HYDROCODONE-ACETAMINOPHEN 5-325 MG PO TABS: 2.0000 | ORAL_TABLET | ORAL | Status: DC | PRN

## 2015-09-11 MED ORDER — AZITHROMYCIN 250 MG PO TABS
1000.0000 mg | ORAL_TABLET | Freq: Once | ORAL | Status: AC
  Administered 2015-09-11: 1000 mg via ORAL
  Filled 2015-09-11: qty 4
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
## 2015-09-11 NOTE — Discharge Instructions (Signed)

## 2015-09-11 NOTE — ED Notes (Signed)
Pt getting upset and requesting to call EMS to transfer him to another ED, pt oriented that EDP will be rounding ASAP. EDP to be notified.

## 2015-09-11 NOTE — ED Provider Notes (Signed)
CSN: [id number]     Arrival date & time 09/11/15  [id number] History   First MD Initiated Contact with Patient 09/11/15 [phone]     Chief Complaint  Patient presents with  . Flank Pain     (Consider location/radiation/quality/duration/timing/severity/associated sxs/prior Treatment) HPI Comments: Patient presents to emergency department for evaluation of flank pain. Patient reports pain in the right side of his back that worsens when he rolls over in bed. Has been ongoing for 2 days. Patient was seen in the ER earlier for swelling of his penis, treated for possible syphilis. ED no from previous visit does not reflect mentioning this pain. He denies any injury.  Patient is a 54 y.o. male presenting with flank pain.  Flank Pain    Past Medical History  Diagnosis Date  . Asthma   . DDD (degenerative disc disease)   . HIV (human immunodeficiency virus infection) ([place])   . Syphilis    History reviewed. No pertinent past surgical history. Family History  Problem Relation Age of Onset  . Asthma Mother   . Cancer Mother    Social History  Substance Use Topics  . Smoking status: Current Every Day Smoker  . Smokeless tobacco: Never Used  . Alcohol Use: 12.0 oz/week    20 Cans of beer per week     Comment: every other day- moderate intake    Review of Systems  Genitourinary: Positive for flank pain.  Musculoskeletal: Positive for back pain.  All other systems reviewed and are negative.     Allergies  Review of patient's allergies indicates no known allergies.  Home Medications   Prior to Admission medications   Medication Sig Start Date End Date Taking? Authorizing Provider  cyclobenzaprine (FLEXERIL) 10 MG tablet Take 1 tablet (10 mg total) by mouth 2 (two) times daily as needed for muscle spasms. 04/23/15   Heather Laisure, PA-C  ibuprofen (ADVIL,MOTRIN) 800 MG tablet Take 1 tablet (800 mg total) by mouth 3 (three) times daily. 04/23/15   Heather Laisure, PA-C  methocarbamol  (ROBAXIN) 500 MG tablet Take 2 tablets (1,000 mg total) by mouth 4 (four) times daily. Patient not taking: Reported on 03/08/2015 01/17/14   Carlisle Cater, PA-C  methocarbamol (ROBAXIN) 500 MG tablet Take 1 tablet (500 mg total) by mouth every 8 (eight) hours as needed for muscle spasms. Patient not taking: Reported on 03/08/2015 05/01/14   Mercedes Camprubi-Soms, PA-C  naproxen (NAPROSYN) 500 MG tablet Take 1 tablet (500 mg total) by mouth 2 (two) times daily. Patient not taking: Reported on 03/08/2015 01/17/14   Carlisle Cater, PA-C  naproxen (NAPROSYN) 500 MG tablet Take 1 tablet (500 mg total) by mouth 2 (two) times daily as needed for mild pain, moderate pain or headache (TAKE WITH MEALS.). Patient not taking: Reported on 03/08/2015 05/01/14   Mercedes Camprubi-Soms, PA-C  oxycodone (OXY-IR) 5 MG capsule Take 1 capsule (5 mg total) by mouth every 4 (four) hours as needed. Patient not taking: Reported on 03/08/2015 01/17/14   Carlisle Cater, PA-C  oxyCODONE-acetaminophen (PERCOCET/ROXICET) 5-325 MG per tablet Take 1-2 tablets by mouth every 4 (four) hours as needed for severe pain. 03/08/15   Charlann Lange, PA-C  predniSONE (DELTASONE) 20 MG tablet 3 Tabs PO Days 1-3, then 2 tabs PO Days 4-6, then 1 tab PO Day 7-9, then Half Tab PO Day 10-12 Patient not taking: Reported on 03/08/2015 01/17/14   Carlisle Cater, PA-C  predniSONE (DELTASONE) 20 MG tablet 3 tabs po daily x 4 days Patient not  taking: Reported on 03/08/2015 05/01/14   Mercedes Camprubi-Soms, PA-C   BP 149/90 mmHg  Pulse 81  Temp(Src) 97.7 F (36.5 C) (Oral)  Resp 18  SpO2 100% Physical Exam  Constitutional: He is oriented to person, place, and time. He appears well-developed and well-nourished. No distress.  HENT:  Head: Normocephalic and atraumatic.  Right Ear: Hearing normal.  Left Ear: Hearing normal.  Nose: Nose normal.  Mouth/Throat: Oropharynx is clear and moist and mucous membranes are normal.  Eyes: Conjunctivae and EOM are normal.  Pupils are equal, round, and reactive to light.  Neck: Normal range of motion. Neck supple.  Cardiovascular: Regular rhythm, S1 normal and S2 normal.  Exam reveals no gallop and no friction rub.   No murmur heard. Pulmonary/Chest: Effort normal and breath sounds normal. No respiratory distress. He exhibits no tenderness.  Abdominal: Soft. Normal appearance and bowel sounds are normal. There is no hepatosplenomegaly. There is no tenderness. There is no rebound, no guarding, no tenderness at McBurney's point and negative Murphy's sign. No hernia.  Musculoskeletal: Normal range of motion.       Lumbar back: He exhibits tenderness.       Back:  Neurological: He is alert and oriented to person, place, and time. He has normal strength. No cranial nerve deficit or sensory deficit. Coordination normal. GCS eye subscore is 4. GCS verbal subscore is 5. GCS motor subscore is 6.  Skin: Skin is warm, dry and intact. No rash noted. No cyanosis.  Psychiatric: He has a normal mood and affect. His speech is normal and behavior is normal. Thought content normal.  Nursing note and vitals reviewed.   ED Course  Procedures (including critical care time) Labs Review Labs Reviewed  URINALYSIS, ROUTINE W REFLEX MICROSCOPIC (NOT AT [facility])    Imaging Review No results found. I have personally reviewed and evaluated these images and lab results as part of my medical decision-making.   EKG Interpretation None      MDM   Final diagnoses:  None   UTI  Resents 2 months apart for evaluation of flank pain. Patient was seen in this ER earlier today and diagnosed with possible syphilis. He was treated with penicillin. Patient reports that he has been expressing right flank pain for 2 days. It is partially reproducible with movement. He did have right CVA area tenderness. Urinalysis performed that show evidence of infection. Patient administer Rocephin and Zithromax, coverage for UTI as well as STD. Will continue  Keflex and analgesics as an outpatient.    Orpah Greek, MD 09/11/15 [phone]

## 2015-09-16 LAB — GC/CHLAMYDIA PROBE AMP (~~LOC~~) NOT AT ARMC
Chlamydia: NEGATIVE
NEISSERIA GONORRHEA: NEGATIVE

## 2015-09-25 ENCOUNTER — Encounter (HOSPITAL_COMMUNITY): Payer: Self-pay | Admitting: Emergency Medicine

## 2015-09-25 ENCOUNTER — Emergency Department (HOSPITAL_COMMUNITY)
Admission: EM | Admit: 2015-09-25 | Discharge: 2015-09-26 | Disposition: A | Discharge status: Home / Self Care | Payer: Medicaid Other | Attending: Emergency Medicine | Admitting: Emergency Medicine

## 2015-09-25 DIAGNOSIS — M5431 Sciatica, right side: Secondary | ICD-10-CM | POA: Insufficient documentation

## 2015-09-25 DIAGNOSIS — F17200 Nicotine dependence, unspecified, uncomplicated: Secondary | ICD-10-CM | POA: Diagnosis not present

## 2015-09-25 DIAGNOSIS — R109 Unspecified abdominal pain: Secondary | ICD-10-CM | POA: Diagnosis not present

## 2015-09-25 DIAGNOSIS — Z8619 Personal history of other infectious and parasitic diseases: Secondary | ICD-10-CM | POA: Diagnosis not present

## 2015-09-25 DIAGNOSIS — M25551 Pain in right hip: Secondary | ICD-10-CM | POA: Insufficient documentation

## 2015-09-25 DIAGNOSIS — Z79899 Other long term (current) drug therapy: Secondary | ICD-10-CM | POA: Diagnosis not present

## 2015-09-25 DIAGNOSIS — Z791 Long term (current) use of non-steroidal anti-inflammatories (NSAID): Secondary | ICD-10-CM | POA: Diagnosis not present

## 2015-09-25 DIAGNOSIS — J45909 Unspecified asthma, uncomplicated: Secondary | ICD-10-CM | POA: Diagnosis not present

## 2015-09-25 DIAGNOSIS — Z792 Long term (current) use of antibiotics: Secondary | ICD-10-CM | POA: Insufficient documentation

## 2015-09-25 DIAGNOSIS — R202 Paresthesia of skin: Secondary | ICD-10-CM | POA: Diagnosis not present

## 2015-09-25 DIAGNOSIS — M545 Low back pain: Secondary | ICD-10-CM | POA: Diagnosis present

## 2015-09-25 DIAGNOSIS — Z8739 Personal history of other diseases of the musculoskeletal system and connective tissue: Secondary | ICD-10-CM | POA: Insufficient documentation

## 2015-09-25 DIAGNOSIS — Z21 Asymptomatic human immunodeficiency virus [HIV] infection status: Secondary | ICD-10-CM | POA: Diagnosis not present

## 2015-09-25 MED ORDER — DEXAMETHASONE SODIUM PHOSPHATE 10 MG/ML IJ SOLN
10.0000 mg | Freq: Once | INTRAMUSCULAR | Status: AC
  Administered 2015-09-25: 10 mg via INTRAMUSCULAR
  Filled 2015-09-25: qty 1

## 2015-09-25 NOTE — ED Notes (Signed)
One touch, see PA notes

## 2015-09-25 NOTE — ED Notes (Signed)
See PA notes

## 2015-09-25 NOTE — ED Provider Notes (Signed)
CSN: [id number]     Arrival date & time 09/25/15  2157 History  By signing my name below, I, Eustaquio Maize, attest that this documentation has been prepared under the direction and in the presence of [name], NP. Electronically Signed: Eustaquio Maize, ED Scribe. 09/25/2015. 10:56 PM.   No chief complaint on file.  Patient is a 54 y.o. male presenting with back pain. The history is provided by the patient. No language interpreter was used.  Back Pain Location:  Lumbar spine Radiates to:  R posterior upper leg Pain severity:  Moderate Pain is:  Same all the time Onset quality:  Gradual Duration:  4 days Timing:  Constant Progression:  Unchanged Chronicity:  Recurrent Relieved by:  None tried Worsened by:  Nothing tried Ineffective treatments:  None tried Associated symptoms: paresthesias   Associated symptoms: no numbness and no weakness      HPI Comments: Steven [name] is a 54 y.o. male who presents to the Emergency Department complaining of gradual onset, constant, lower back pain radiating down right thigh x 4 days. He has been taking Ibuprofen without relief. Pt also complains of paresthesias to his bilateral feet. No hx DM. Denies weakness, numbness, or any other associated symptoms. Pt has had similar symptoms in the past which he has been evaluated for.   Past Medical History  Diagnosis Date  . Asthma   . DDD (degenerative disc disease)   . HIV (human immunodeficiency virus infection) ([place])   . Syphilis    No past surgical history on file. Family History  Problem Relation Age of Onset  . Asthma Mother   . Cancer Mother    Social History  Substance Use Topics  . Smoking status: Current Every Day Smoker  . Smokeless tobacco: Never Used  . Alcohol Use: 12.0 oz/week    20 Cans of beer per week     Comment: every other day- moderate intake    Review of Systems  Musculoskeletal: Positive for back pain and arthralgias (Right thigh pain. ).  Neurological:  Positive for paresthesias. Negative for weakness and numbness.       + Paresthesias to bilateral feet  All other systems reviewed and are negative.   Allergies  Review of patient's allergies indicates no known allergies.  Home Medications   Prior to Admission medications   Medication Sig Start Date End Date Taking? Authorizing Provider  cephALEXin (KEFLEX) 500 MG capsule Take 1 capsule (500 mg total) by mouth 4 (four) times daily. 09/11/15   Orpah Greek, MD  cyclobenzaprine (FLEXERIL) 10 MG tablet Take 1 tablet (10 mg total) by mouth 2 (two) times daily as needed for muscle spasms. 04/23/15   Hyman Bible, PA-C  HYDROcodone-acetaminophen (NORCO/VICODIN) 5-325 MG tablet Take 2 tablets by mouth every 4 (four) hours as needed for moderate pain. 09/11/15   Orpah Greek, MD  ibuprofen (ADVIL,MOTRIN) 800 MG tablet Take 1 tablet (800 mg total) by mouth 3 (three) times daily. 04/23/15   Heather Laisure, PA-C  methocarbamol (ROBAXIN) 500 MG tablet Take 2 tablets (1,000 mg total) by mouth 4 (four) times daily. Patient not taking: Reported on 03/08/2015 01/17/14   Carlisle Cater, PA-C  methocarbamol (ROBAXIN) 500 MG tablet Take 1 tablet (500 mg total) by mouth every 8 (eight) hours as needed for muscle spasms. Patient not taking: Reported on 03/08/2015 05/01/14   Mercedes Camprubi-Soms, PA-C  naproxen (NAPROSYN) 500 MG tablet Take 1 tablet (500 mg total) by mouth 2 (two) times daily. Patient  not taking: Reported on 03/08/2015 01/17/14   Carlisle Cater, PA-C  naproxen (NAPROSYN) 500 MG tablet Take 1 tablet (500 mg total) by mouth 2 (two) times daily as needed for mild pain, moderate pain or headache (TAKE WITH MEALS.). Patient not taking: Reported on 03/08/2015 05/01/14   Mercedes Camprubi-Soms, PA-C  oxycodone (OXY-IR) 5 MG capsule Take 1 capsule (5 mg total) by mouth every 4 (four) hours as needed. Patient not taking: Reported on 03/08/2015 01/17/14   Carlisle Cater, PA-C  oxyCODONE-acetaminophen  (PERCOCET/ROXICET) 5-325 MG per tablet Take 1-2 tablets by mouth every 4 (four) hours as needed for severe pain. 03/08/15   Charlann Lange, PA-C  predniSONE (DELTASONE) 20 MG tablet 3 Tabs PO Days 1-3, then 2 tabs PO Days 4-6, then 1 tab PO Day 7-9, then Half Tab PO Day 10-12 Patient not taking: Reported on 03/08/2015 01/17/14   Carlisle Cater, PA-C  predniSONE (DELTASONE) 20 MG tablet 3 tabs po daily x 4 days Patient not taking: Reported on 03/08/2015 05/01/14   Mercedes Camprubi-Soms, PA-C  promethazine (PHENERGAN) 25 MG tablet Take 1 tablet (25 mg total) by mouth every 6 (six) hours as needed for nausea or vomiting. 09/11/15   Orpah Greek, MD   Triage Vitals: BP 114/82 mmHg  Pulse 85  Temp(Src) 97.8 F (36.6 C) (Oral)  Resp 16  SpO2 97%   Physical Exam  Constitutional: He is oriented to person, place, and time. He appears well-developed and well-nourished. No distress.  HENT:  Head: Normocephalic and atraumatic.  Eyes: Conjunctivae and EOM are normal.  Neck: Neck supple. No tracheal deviation present.  Cardiovascular: Normal rate.   Pulmonary/Chest: Effort normal. No respiratory distress.  Abdominal: Soft.  Right flank pain  Musculoskeletal: Normal range of motion.  Right hip pain radiates into his thigh, consistent with sciatica  Neurological: He is alert and oriented to person, place, and time.  Skin: Skin is warm and dry.  Psychiatric: He has a normal mood and affect. His behavior is normal.  Nursing note and vitals reviewed.   ED Course  Procedures (including critical care time)  DIAGNOSTIC STUDIES: Oxygen Saturation is 97% on RA, normal by my interpretation.    COORDINATION OF CARE: 10:53 PM-Discussed treatment plan which includes CBG and Decadron injection with pt at bedside and pt agreed to plan.   Labs Review Labs Reviewed - No data to display  Imaging Review No results found.   EKG Interpretation None      MDM   Final diagnoses:  None   Patient  with back pain.  No neurological deficits and normal neuro exam.  Patient is ambulatory.  No loss of bowel or bladder control.  No concern for cauda equina.  No fever, night sweats, weight loss, h/o cancer, IVDA, no recent procedure to back. No urinary symptoms suggestive of UTI.  Supportive care and return precaution discussed. Appears safe for discharge at this time. Follow up as indicated in discharge paperwork.   I personally performed the services described in this documentation, which was scribed in my presence. The recorded information has been reviewed and is accurate.      [name], NP 09/26/15 [id number]  Varney Biles, MD 09/30/15 [phone]

## 2015-09-25 NOTE — Discharge Instructions (Signed)
Sciatica Sciatica is pain, weakness, numbness, or tingling along your sciatic nerve. The nerve starts in the lower back and runs down the back of each leg. Nerve damage or certain conditions pinch or put pressure on the sciatic nerve. This causes the pain, weakness, and other discomforts of sciatica. HOME CARE   Only take medicine as told by your doctor.  Apply ice to the affected area for 20 minutes. Do this 3-4 times a day for the first 48-72 hours. Then try heat in the same way.  Exercise, stretch, or do your usual activities if these do not make your pain worse.  Go to physical therapy as told by your doctor.  Keep all doctor visits as told.  Do not wear high heels or shoes that are not supportive.  Get a firm mattress if your mattress is too soft to lessen pain and discomfort. GET HELP RIGHT AWAY IF:   You cannot control when you poop (bowel movement) or pee (urinate).  You have more weakness in your lower back, lower belly (pelvis), butt (buttocks), or legs.  You have redness or puffiness (swelling) of your back.  You have a burning feeling when you pee.  You have pain that gets worse when you lie down.  You have pain that wakes you from your sleep.  Your pain is worse than past pain.  Your pain lasts longer than 4 weeks.  You are suddenly losing weight without reason. MAKE SURE YOU:   Understand these instructions.  Will watch this condition.  Will get help right away if you are not doing well or get worse.   This information is not intended to replace advice given to you by your health care provider. Make sure you discuss any questions you have with your health care provider.   Document Released: 07/13/2008 Document Revised: 06/25/2015 Document Reviewed: 02/13/2012 Elsevier Interactive Patient Education 2016 [place] THE IBUPROFEN, 800 MG, EVERY EIGHT HOURS FOR THE NEXT THREE DAYS. [place].

## 2015-09-29 LAB — CBG MONITORING, ED: Glucose-Capillary: 110 mg/dL — ABNORMAL HIGH (ref 65–99)

## 2015-11-19 ENCOUNTER — Encounter (HOSPITAL_COMMUNITY): Payer: Self-pay | Admitting: Family Medicine

## 2015-11-19 ENCOUNTER — Emergency Department (HOSPITAL_COMMUNITY): Payer: Medicaid Other

## 2015-11-19 ENCOUNTER — Emergency Department (HOSPITAL_COMMUNITY)
Admission: EM | Admit: 2015-11-19 | Discharge: 2015-11-19 | Disposition: A | Discharge status: Home / Self Care | Payer: Medicaid Other | Attending: Emergency Medicine | Admitting: Emergency Medicine

## 2015-11-19 DIAGNOSIS — S29002A Unspecified injury of muscle and tendon of back wall of thorax, initial encounter: Secondary | ICD-10-CM | POA: Insufficient documentation

## 2015-11-19 DIAGNOSIS — Z791 Long term (current) use of non-steroidal anti-inflammatories (NSAID): Secondary | ICD-10-CM | POA: Diagnosis not present

## 2015-11-19 DIAGNOSIS — Z8739 Personal history of other diseases of the musculoskeletal system and connective tissue: Secondary | ICD-10-CM | POA: Diagnosis not present

## 2015-11-19 DIAGNOSIS — J45909 Unspecified asthma, uncomplicated: Secondary | ICD-10-CM | POA: Insufficient documentation

## 2015-11-19 DIAGNOSIS — Z792 Long term (current) use of antibiotics: Secondary | ICD-10-CM | POA: Diagnosis not present

## 2015-11-19 DIAGNOSIS — S0231XA Fracture of orbital floor, right side, initial encounter for closed fracture: Secondary | ICD-10-CM | POA: Diagnosis not present

## 2015-11-19 DIAGNOSIS — Y9289 Other specified places as the place of occurrence of the external cause: Secondary | ICD-10-CM | POA: Insufficient documentation

## 2015-11-19 DIAGNOSIS — Z21 Asymptomatic human immunodeficiency virus [HIV] infection status: Secondary | ICD-10-CM | POA: Insufficient documentation

## 2015-11-19 DIAGNOSIS — Y998 Other external cause status: Secondary | ICD-10-CM | POA: Insufficient documentation

## 2015-11-19 DIAGNOSIS — S022XXA Fracture of nasal bones, initial encounter for closed fracture: Secondary | ICD-10-CM | POA: Diagnosis not present

## 2015-11-19 DIAGNOSIS — R52 Pain, unspecified: Secondary | ICD-10-CM

## 2015-11-19 DIAGNOSIS — Y9389 Activity, other specified: Secondary | ICD-10-CM | POA: Diagnosis not present

## 2015-11-19 DIAGNOSIS — F17200 Nicotine dependence, unspecified, uncomplicated: Secondary | ICD-10-CM | POA: Diagnosis not present

## 2015-11-19 DIAGNOSIS — S0591XA Unspecified injury of right eye and orbit, initial encounter: Secondary | ICD-10-CM | POA: Diagnosis present

## 2015-11-19 DIAGNOSIS — Z8619 Personal history of other infectious and parasitic diseases: Secondary | ICD-10-CM | POA: Diagnosis not present

## 2015-11-19 MED ORDER — OXYCODONE-ACETAMINOPHEN 5-325 MG PO TABS
1.0000 | ORAL_TABLET | Freq: Once | ORAL | Status: AC
  Administered 2015-11-19: 1 via ORAL
  Filled 2015-11-19: qty 1

## 2015-11-19 MED ORDER — OXYCODONE-ACETAMINOPHEN 5-325 MG PO TABS: 1.0000 | ORAL_TABLET | ORAL | Status: AC | PRN
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
## 2015-12-09 ENCOUNTER — Encounter (HOSPITAL_COMMUNITY): Payer: Self-pay | Admitting: Emergency Medicine

## 2015-12-09 ENCOUNTER — Emergency Department (HOSPITAL_COMMUNITY)
Admission: EM | Admit: 2015-12-09 | Discharge: 2015-12-09 | Disposition: A | Discharge status: Home / Self Care | Payer: Medicaid Other | Attending: Emergency Medicine | Admitting: Emergency Medicine

## 2015-12-09 DIAGNOSIS — F17200 Nicotine dependence, unspecified, uncomplicated: Secondary | ICD-10-CM | POA: Diagnosis not present

## 2015-12-09 DIAGNOSIS — M545 Low back pain: Secondary | ICD-10-CM | POA: Diagnosis present

## 2015-12-09 DIAGNOSIS — M5431 Sciatica, right side: Secondary | ICD-10-CM | POA: Diagnosis not present

## 2015-12-09 DIAGNOSIS — R3915 Urgency of urination: Secondary | ICD-10-CM | POA: Diagnosis not present

## 2015-12-09 DIAGNOSIS — Z21 Asymptomatic human immunodeficiency virus [HIV] infection status: Secondary | ICD-10-CM | POA: Insufficient documentation

## 2015-12-09 DIAGNOSIS — M5432 Sciatica, left side: Secondary | ICD-10-CM | POA: Diagnosis not present

## 2015-12-09 DIAGNOSIS — Z8619 Personal history of other infectious and parasitic diseases: Secondary | ICD-10-CM | POA: Diagnosis not present

## 2015-12-09 DIAGNOSIS — J45909 Unspecified asthma, uncomplicated: Secondary | ICD-10-CM | POA: Insufficient documentation

## 2015-12-09 MED ORDER — NAPROXEN 500 MG PO TABS: 500.0000 mg | ORAL_TABLET | Freq: Two times a day (BID) | ORAL | Status: DC

## 2015-12-09 MED ORDER — OXYCODONE-ACETAMINOPHEN 5-325 MG PO TABS
1.0000 | ORAL_TABLET | Freq: Once | ORAL | Status: AC
  Administered 2015-12-09: 1 via ORAL
  Filled 2015-12-09: qty 1

## 2015-12-09 MED ORDER — METHOCARBAMOL 500 MG PO TABS: 500.0000 mg | ORAL_TABLET | Freq: Two times a day (BID) | ORAL | Status: DC

## 2015-12-09 NOTE — ED Provider Notes (Signed)
CSN: [id number]     Arrival date & time 12/09/15  1409 History  By signing my name below, I, Steven [name], attest that this documentation has been prepared under the direction and in the presence of Steven Quill NP. Electronically Signed: Altamease [name], ED Scribe. 12/09/2015 4:38 PM   Chief Complaint  Patient presents with  . Back Pain   Patient is a 55 y.o. male presenting with back pain. The history is provided by the patient. No language interpreter was used.  Back Pain Location:  Lumbar spine Radiates to:  R thigh Pain severity:  Severe Onset quality:  Gradual Duration:  3 days Timing:  Constant Chronicity:  Chronic Context: physical stress   Relieved by:  Nothing Worsened by:  Nothing tried Ineffective treatments:  None tried Associated symptoms: no bladder incontinence, no bowel incontinence, no numbness, no perianal numbness, no tingling and no weakness   Risk factors: no hx of cancer, not obese and no recent surgery    Steven [name] is a 55 y.o. male with history of DDD and HIV who presents to the Emergency Department complaining of constant, 9/10 in severity, bilateral lower back pain with gradual onset 3 days ago after pushing a car. The pain radiates to the right thigh. He took nothing for pain at home but notes that ibuprofen and flexeril have been effective in the past when he cannot get Vicodin or Percocet. Pt denies numbness, tingling, weakness, and incontinence of bowel or bladder.   Past Medical History  Diagnosis Date  . Asthma   . DDD (degenerative disc disease)   . HIV (human immunodeficiency virus infection) ([place])   . Syphilis    History reviewed. No pertinent past surgical history. Family History  Problem Relation Age of Onset  . Asthma Mother   . Cancer Mother    Social History  Substance Use Topics  . Smoking status: Current Every Day Smoker  . Smokeless tobacco: Never Used  . Alcohol Use: 12.0 oz/week    20 Cans of beer per week      Comment: every other day- moderate intake    Review of Systems  Gastrointestinal: Negative for bowel incontinence.  Genitourinary: Positive for urgency. Negative for bladder incontinence and difficulty urinating (no incontinence).  Musculoskeletal: Positive for back pain.  Neurological: Negative for tingling, weakness and numbness.  All other systems reviewed and are negative.   Allergies  Review of patient's allergies indicates no known allergies.  Home Medications   Prior to Admission medications   Medication Sig Start Date End Date Taking? Authorizing Provider  methocarbamol (ROBAXIN) 500 MG tablet Take 1 tablet (500 mg total) by mouth 2 (two) times daily. 12/09/15   Steven Quill, NP  naproxen (NAPROSYN) 500 MG tablet Take 1 tablet (500 mg total) by mouth 2 (two) times daily. 12/09/15   Steven Quill, NP  oxyCODONE-acetaminophen (PERCOCET) 5-325 MG tablet Take 1 tablet by mouth every 4 (four) hours as needed for moderate pain. 0000000   Delora Fuel, MD   BP [id number] mmHg  Pulse 75  Temp(Src) 97.9 F (36.6 C) (Oral)  Resp 18  SpO2 97% Physical Exam  Constitutional: He is oriented to person, place, and time. He appears well-developed and well-nourished. No distress.  HENT:  Head: Normocephalic and atraumatic.  Eyes: Conjunctivae and EOM are normal.  Neck: Neck supple. No tracheal deviation present.  Cardiovascular: Normal rate.   Pulmonary/Chest: Effort normal. No respiratory distress.  Musculoskeletal: Normal range of motion.  Left hip and right  thigh pain No strength deficits No red flag signs for cauda equina   Neurological: He is alert and oriented to person, place, and time.  Skin: Skin is warm and dry.  Psychiatric: He has a normal mood and affect. His behavior is normal.  Nursing note and vitals reviewed.   ED Course  Procedures (including critical care time) DIAGNOSTIC STUDIES: Oxygen Saturation is 97% on RA,  normal by my interpretation.    COORDINATION OF  CARE: 4:33 PM Discussed treatment plan which includes percocet in the ED and discharge with Robaxin and naproxen with pt at bedside and pt agreed to plan.  Labs Review Labs Reviewed - No data to display  Imaging Review No results found.    EKG Interpretation None      MDM   Final diagnoses:  Bilateral sciatica    Patient with back pain consistent with sciatica.  No neurological deficits and normal neuro exam. No loss of bowel or bladder control.  No concern for cauda equina.  No fever, night sweats, weight loss, h/o cancer, IVDU.  Discharged home with Robaxin and naproxen.   I personally performed the services described in this documentation, which was scribed in my presence. The recorded information has been reviewed and is accurate.   Steven Quill, NP 12/09/15 1818  Virgel Manifold, MD 12/11/15 2249

## 2015-12-09 NOTE — Discharge Instructions (Signed)

## 2015-12-09 NOTE — ED Notes (Signed)
Pt sts left sided lower back pain from sciatica with hx of same per pt x 3 days
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
## 2017-02-07 ENCOUNTER — Emergency Department (HOSPITAL_COMMUNITY)
Admission: EM | Admit: 2017-02-07 | Discharge: 2017-02-07 | Disposition: A | Discharge status: Home / Self Care | Payer: Medicaid Other | Attending: Emergency Medicine | Admitting: Emergency Medicine

## 2017-02-07 ENCOUNTER — Encounter (HOSPITAL_COMMUNITY): Payer: Self-pay | Admitting: *Deleted

## 2017-02-07 ENCOUNTER — Emergency Department (HOSPITAL_COMMUNITY): Payer: Medicaid Other

## 2017-02-07 DIAGNOSIS — F17200 Nicotine dependence, unspecified, uncomplicated: Secondary | ICD-10-CM | POA: Diagnosis not present

## 2017-02-07 DIAGNOSIS — Y999 Unspecified external cause status: Secondary | ICD-10-CM | POA: Insufficient documentation

## 2017-02-07 DIAGNOSIS — J45909 Unspecified asthma, uncomplicated: Secondary | ICD-10-CM | POA: Insufficient documentation

## 2017-02-07 DIAGNOSIS — Y9372 Activity, wrestling: Secondary | ICD-10-CM | POA: Diagnosis not present

## 2017-02-07 DIAGNOSIS — Y9289 Other specified places as the place of occurrence of the external cause: Secondary | ICD-10-CM | POA: Insufficient documentation

## 2017-02-07 DIAGNOSIS — X58XXXA Exposure to other specified factors, initial encounter: Secondary | ICD-10-CM | POA: Insufficient documentation

## 2017-02-07 DIAGNOSIS — M79641 Pain in right hand: Secondary | ICD-10-CM

## 2017-02-07 DIAGNOSIS — S6992XA Unspecified injury of left wrist, hand and finger(s), initial encounter: Secondary | ICD-10-CM | POA: Diagnosis present

## 2017-02-07 DIAGNOSIS — S62398A Other fracture of other metacarpal bone, initial encounter for closed fracture: Secondary | ICD-10-CM

## 2017-02-07 DIAGNOSIS — S62317A Displaced fracture of base of fifth metacarpal bone. left hand, initial encounter for closed fracture: Secondary | ICD-10-CM | POA: Diagnosis not present

## 2017-02-07 MED ORDER — HYDROCODONE-ACETAMINOPHEN 5-325 MG PO TABS: 2.0000 | ORAL_TABLET | ORAL | 0 refills | Status: DC | PRN

## 2017-02-07 NOTE — Discharge Instructions (Signed)
Return if any problems.

## 2017-02-07 NOTE — ED Triage Notes (Signed)
States that he injured his had 1 week ago. Pt states that pain has continued.

## 2017-02-07 NOTE — Progress Notes (Signed)
Orthopedic Tech Progress Note Patient Details:  Steven [name] [birth year] [id number]  Ortho Devices Type of Ortho Device: Ace wrap, Ulna gutter splint Ortho Device/Splint Location: RUE Ortho Device/Splint Interventions: Ordered, Application   Braulio Bosch 02/07/2017, 3:18 PM

## 2017-02-07 NOTE — ED Notes (Signed)
Ortho tech paged. Will be by shortly.

## 2017-02-07 NOTE — ED Provider Notes (Signed)
[facility] DEPT Provider Note   CSN: [id number] Arrival date & time: 02/07/17  1258  By signing my name below, I, Ethelle Lyon Long, attest that this documentation has been prepared under the direction and in the presence of Helen Hashimoto, [place]. Electronically Signed: Ethelle Lyon Long, Scribe. 02/07/2017. 2:18 PM.  History   Chief Complaint Chief Complaint  Patient presents with  . Hand Pain   The history is provided by the patient and medical records. No language interpreter was used.    HPI Comments:  Steven [name] is a 56 y.o. male with a PMHx of the Asthma, Syphilis, DDD, and HIV, who presents to the Emergency Department complaining of constant, 7/10, right hand pain onset over one week ago. Pt reports wrestling with his brother over a week ago when he experienced sudden onset right hand pain. Pt has an associated symptom of swelling to the area. He takes Ibuprofen and Flexeril at home for his constant neck pain with minimal relief of his hand pain. Direct palpation and exertion exacerbate the hand pain. He denies fever and any other injuries at this time. Pt is a current every day smoker.    Past Medical History:  Diagnosis Date  . Asthma   . DDD (degenerative disc disease)   . HIV (human immunodeficiency virus infection) ([place])   . Syphilis    Patient Active Problem List   Diagnosis Date Noted  . CIRRHOSIS 02/05/2009  . CERVICAL RADICULOPATHY 02/05/2009  . 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 BURSITIS 02/05/2009  . CHEST PAIN-UNSPECIFIED 02/05/2009   History reviewed. No pertinent surgical history.  Home Medications    Prior to Admission medications   Medication Sig Start Date End Date Taking? Authorizing Provider  methocarbamol (ROBAXIN) 500 MG tablet Take 1 tablet (500 mg total) by mouth 2 (two) times daily. 12/09/15   [name], NP  naproxen (NAPROSYN) 500 MG tablet Take 1 tablet (500 mg total) by mouth 2 (two) times daily. 12/09/15   [name], NP    oxyCODONE-acetaminophen (PERCOCET) 5-325 MG tablet Take 1 tablet by mouth every 4 (four) hours as needed for moderate pain. 03/20/00   Delora Fuel, MD   Family History Family History  Problem Relation Age of Onset  . Asthma Mother   . Cancer Mother    Social History Social History  Substance Use Topics  . Smoking status: Current Every Day Smoker  . Smokeless tobacco: Never Used  . Alcohol use 12.0 oz/week    20 Cans of beer per week     Comment: every other day- moderate intake   Allergies   Patient has no known allergies.   Review of Systems Review of Systems  Constitutional: Negative for fever.  Musculoskeletal: Positive for arthralgias and myalgias.  All other systems reviewed and are negative.    Physical Exam Updated Vital Signs BP (!) 129/91 (BP Location: Left Arm)   Pulse 90   Temp 98.7 F (37.1 C) (Oral)   Resp 16   SpO2 96%   Physical Exam  Constitutional: He is oriented to person, place, and time. He appears well-developed and well-nourished.  HENT:  Head: Normocephalic.  Eyes: Conjunctivae are normal.  Cardiovascular: Normal rate.   Pulmonary/Chest: Effort normal.  Abdominal: He exhibits no distension.  Musculoskeletal: Normal range of motion.  Swollen right fifth metacarpal. Full ROM. Neurovascularly  and sensory intact.   Neurological: He is alert and oriented to person, place, and time.  Skin: Skin is warm and dry.  Psychiatric: He  has a normal mood and affect.  Nursing note and vitals reviewed.    ED Treatments / Results  DIAGNOSTIC STUDIES:  Oxygen Saturation is 96% on RA, adequate by my interpretation.    COORDINATION OF CARE:  2:12 PM Discussed treatment plan with pt at bedside including XR of right hand with a splint and pt agreed to plan.  Labs (all labs ordered are listed, but only abnormal results are displayed) Labs Reviewed - No data to display  EKG  EKG Interpretation None       Radiology Dg Hand Complete  Right  Result Date: 02/07/2017 CLINICAL DATA:  Trauma 1 week ago.  Pain. EXAM: RIGHT HAND - COMPLETE 3+ VIEW COMPARISON:  None. FINDINGS: There is an angulated and displaced fracture of the distal fifth metacarpal. This fracture is comminuted. No other acute abnormalities are identified. IMPRESSION: Angulated, displaced, comminuted fracture of the distal fifth metacarpal. Electronically Signed   By: Dorise Bullion III M.D   On: 02/07/2017 13:43    Procedures Procedures (including critical care time)  Medications Ordered in ED Medications - No data to display   Initial Impression / Assessment and Plan / ED Course  I have reviewed the triage vital signs and the nursing notes.  Pertinent labs & imaging results that were available during my care of the patient were reviewed by me and considered in my medical decision making (see chart for details).     Patient X-Ray Impression: Angulated, displaced, comminuted fracture of the distal fifth metacarpal.  Pt advised to follow up with orthopedics. Patient given splint while in ED, conservative therapy recommended and discussed. Patient will be discharged home & is agreeable with above plan. Returns precautions discussed. Pt appears safe for discharge.  Final Clinical Impressions(s) / ED Diagnoses   Final diagnoses:  Other closed nondisplaced fracture of fifth metacarpal, initial encounter  Right hand pain    New Prescriptions New Prescriptions   HYDROCODONE-ACETAMINOPHEN (NORCO/VICODIN) 5-325 MG TABLET    Take 2 tablets by mouth every 4 (four) hours as needed.    Ulnar gutter splint Schedule to see Dr. Lenon Curt for evaluation An After Visit Summary was printed and given to the patient.     Hollace Kinnier Yarnell, PA-C 02/07/17 [place], MD 02/07/17 1946

## 2017-12-05 ENCOUNTER — Encounter (HOSPITAL_COMMUNITY): Payer: Self-pay

## 2017-12-05 ENCOUNTER — Emergency Department (HOSPITAL_COMMUNITY)
Admission: EM | Admit: 2017-12-05 | Discharge: 2017-12-05 | Disposition: A | Discharge status: Home / Self Care | Payer: Medicaid Other | Attending: Emergency Medicine | Admitting: Emergency Medicine

## 2017-12-05 ENCOUNTER — Other Ambulatory Visit: Payer: Self-pay

## 2017-12-05 DIAGNOSIS — Z5321 Procedure and treatment not carried out due to patient leaving prior to being seen by health care provider: Secondary | ICD-10-CM | POA: Diagnosis not present

## 2017-12-05 DIAGNOSIS — R109 Unspecified abdominal pain: Secondary | ICD-10-CM | POA: Diagnosis present

## 2017-12-05 LAB — URINALYSIS, ROUTINE W REFLEX MICROSCOPIC
BILIRUBIN URINE: NEGATIVE
GLUCOSE, UA: NEGATIVE mg/dL
KETONES UR: 20 mg/dL — AB
NITRITE: NEGATIVE
PH: 6 (ref 5.0–8.0)
PROTEIN: NEGATIVE mg/dL
Specific Gravity, Urine: 1.018 (ref 1.005–1.030)

## 2017-12-05 LAB — CBC
HCT: 49.4 % (ref 39.0–52.0)
Hemoglobin: 16.6 g/dL (ref 13.0–17.0)
MCH: 29.3 pg (ref 26.0–34.0)
MCHC: 33.6 g/dL (ref 30.0–36.0)
MCV: 87.1 fL (ref 78.0–100.0)
PLATELETS: 201 10*3/uL (ref 150–400)
RBC: 5.67 MIL/uL (ref 4.22–5.81)
RDW: 14.7 % (ref 11.5–15.5)
WBC: 8 10*3/uL (ref 4.0–10.5)

## 2017-12-05 LAB — COMPREHENSIVE METABOLIC PANEL
ALK PHOS: 111 U/L (ref 38–126)
ALT: 21 U/L (ref 17–63)
ANION GAP: 11 (ref 5–15)
AST: 25 U/L (ref 15–41)
Albumin: 3.8 g/dL (ref 3.5–5.0)
BUN: 6 mg/dL (ref 6–20)
CALCIUM: 9.3 mg/dL (ref 8.9–10.3)
CHLORIDE: 104 mmol/L (ref 101–111)
CO2: 24 mmol/L (ref 22–32)
CREATININE: 1.12 mg/dL (ref 0.61–1.24)
GFR calc non Af Amer: >60 mL/min (ref >60)
Glucose, Bld: 93 mg/dL (ref 65–99)
Potassium: 4.7 mmol/L (ref 3.5–5.1)
SODIUM: 139 mmol/L (ref 135–145)
Total Bilirubin: 0.9 mg/dL (ref 0.3–1.2)
Total Protein: 7.1 g/dL (ref 6.5–8.1)

## 2017-12-05 LAB — LIPASE, BLOOD: LIPASE: 22 U/L (ref 11–51)

## 2017-12-05 NOTE — ED Triage Notes (Signed)
Pt presents to the ed with complaints of nausea, abdominal pain and constipation x 2 weeks.

## 2017-12-05 NOTE — ED Notes (Signed)
Pt was seen turning in their labels and walking out. RN informed

## 2018-11-25 ENCOUNTER — Other Ambulatory Visit: Payer: Self-pay

## 2018-11-25 ENCOUNTER — Encounter (HOSPITAL_COMMUNITY): Payer: Self-pay | Admitting: Emergency Medicine

## 2018-11-25 ENCOUNTER — Emergency Department (HOSPITAL_COMMUNITY)
Admission: EM | Admit: 2018-11-25 | Discharge: 2018-11-25 | Disposition: A | Discharge status: Home / Self Care | Payer: Medicaid Other | Attending: Emergency Medicine | Admitting: Emergency Medicine

## 2018-11-25 DIAGNOSIS — G8929 Other chronic pain: Secondary | ICD-10-CM

## 2018-11-25 DIAGNOSIS — M545 Low back pain, unspecified: Secondary | ICD-10-CM

## 2018-11-25 DIAGNOSIS — F17210 Nicotine dependence, cigarettes, uncomplicated: Secondary | ICD-10-CM | POA: Insufficient documentation

## 2018-11-25 DIAGNOSIS — M62830 Muscle spasm of back: Secondary | ICD-10-CM

## 2018-11-25 DIAGNOSIS — M542 Cervicalgia: Secondary | ICD-10-CM | POA: Insufficient documentation

## 2018-11-25 DIAGNOSIS — Z79899 Other long term (current) drug therapy: Secondary | ICD-10-CM | POA: Diagnosis not present

## 2018-11-25 MED ORDER — KETOROLAC TROMETHAMINE 15 MG/ML IJ SOLN
15.0000 mg | Freq: Once | INTRAMUSCULAR | Status: AC
  Administered 2018-11-25: 15 mg via INTRAMUSCULAR
  Filled 2018-11-25: qty 1

## 2018-11-25 MED ORDER — IBUPROFEN 800 MG PO TABS: 800.0000 mg | ORAL_TABLET | Freq: Three times a day (TID) | ORAL | 0 refills | Status: DC

## 2018-11-25 MED ORDER — DEXAMETHASONE SODIUM PHOSPHATE 10 MG/ML IJ SOLN
10.0000 mg | Freq: Once | INTRAMUSCULAR | Status: AC
  Administered 2018-11-25: 10 mg via INTRAMUSCULAR
  Filled 2018-11-25: qty 1

## 2018-11-25 MED ORDER — CYCLOBENZAPRINE HCL 10 MG PO TABS: 10.0000 mg | ORAL_TABLET | Freq: Two times a day (BID) | ORAL | 0 refills | Status: DC | PRN

## 2018-11-25 NOTE — Discharge Instructions (Addendum)
It is important to continue to communicate with your primary care doctor for refills of your chronic medications. Take ibuprofen 3 times a day with meals.  Do not take other anti-inflammatories at the same time (Advil, Motrin, naproxen, Aleve). You may supplement with Tylenol if you need further pain control. Use ice packs or heating pads if this helps control your pain. Use muscle relaxers as needed for spasm. Return to the emergency room if you develop fevers, loss of bowel bladder control, inability to walk, or any new, worsening, or concerning symptoms.

## 2018-11-25 NOTE — ED Provider Notes (Signed)
[facility] EMERGENCY DEPARTMENT Provider Note   CSN: [id number] Arrival date & time: 11/25/18  1335     History   Chief Complaint Chief Complaint  Patient presents with  . Neck Pain  . Back Pain    HPI Steven [name] is a 58 y.o. male presenting for evaluation of neck and back pain.  Patient states he has chronic neck pain related to degenerative disc disease.  He also reports intermittent left low back pain.  Over the past 3 days, pain has been significantly worsened.  2 weeks ago, he saw his PCP, but they did not refill his Flexeril and ibuprofen at that time.  He does not know why.  He has called his PCP, but has not heard back from them.  He has had nothing for pain in the past several days.  He denies fall, trauma, injury.  Denies fevers, chills, history of cancer, history of IVDU.  He denies loss of bowel bladder control, numbness, tingling.  Patient reports his back pain is described as a spasm.  Does not radiate.  No midline back pain.  Neck pain is constant, worse with movement of his head.  Nothing makes it better.  Additional history obtained from chart review.  Patient had a lung mass at one point it was concerning for possible cancer, but per patient, repeat imaging showed complete resolution of the mass and nothing was there to be biopsied. Additionally, patient at one point had + HIV in his chart.  Patient states he never had positive HIV testing, and [facility]   HPI  Past Medical History:  Diagnosis Date  . Asthma   . DDD (degenerative disc disease)   . Syphilis     Patient Active Problem List   Diagnosis Date Noted  . CIRRHOSIS 02/05/2009  . CERVICAL RADICULOPATHY 02/05/2009  . 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 BURSITIS 02/05/2009  . CHEST PAIN-UNSPECIFIED 02/05/2009    History reviewed. No pertinent surgical history.      Home Medications    Prior to Admission medications   Medication Sig Start Date End  Date Taking? Authorizing Provider  cyclobenzaprine (FLEXERIL) 10 MG tablet Take 1 tablet (10 mg total) by mouth 2 (two) times daily as needed for muscle spasms. 11/25/18   Pernella Ackerley, PA-C  HYDROcodone-acetaminophen (NORCO/VICODIN) 5-325 MG tablet Take 2 tablets by mouth every 4 (four) hours as needed. 02/07/17   Fransico Meadow, PA-C  ibuprofen (ADVIL,MOTRIN) 800 MG tablet Take 1 tablet (800 mg total) by mouth 3 (three) times daily with meals. 11/25/18   Maebry Obrien, PA-C  methocarbamol (ROBAXIN) 500 MG tablet Take 1 tablet (500 mg total) by mouth 2 (two) times daily. 12/09/15   [name], NP  naproxen (NAPROSYN) 500 MG tablet Take 1 tablet (500 mg total) by mouth 2 (two) times daily. 12/09/15   [name], NP  oxyCODONE-acetaminophen (PERCOCET) 5-325 MG tablet Take 1 tablet by mouth every 4 (four) hours as needed for moderate pain. 06/23/27   Delora Fuel, MD    Family History Family History  Problem Relation Age of Onset  . Asthma Mother   . Cancer Mother     Social History Social History   Tobacco Use  . Smoking status: Current Every Day Smoker    Packs/day: 0.50  . Smokeless tobacco: Never Used  Substance Use Topics  . Alcohol use: Yes    Alcohol/week: 20.0 standard drinks    Types: 20 Cans of  beer per week    Comment: every other day- moderate intake  . Drug use: Yes    Types: Marijuana    Comment: marijuana 3-4 days a week, crack     Allergies   Patient has no known allergies.   Review of Systems Review of Systems  Musculoskeletal: Positive for back pain and neck pain.  All other systems reviewed and are negative.    Physical Exam Updated Vital Signs BP (!) 145/99 (BP Location: Right Arm)   Pulse 90   Temp 98.6 F (37 C) (Oral)   Resp 14   Ht 5\' 6"  (1.676 m)   Wt 59 kg   SpO2 96%   BMI 20.98 kg/m   Physical Exam Vitals signs and nursing note reviewed.  Constitutional:      General: He is not in acute distress.    Appearance: He is  well-developed.     Comments: Appears uncomfortable due to pain, in no acute distress  HENT:     Head: Normocephalic and atraumatic.  Eyes:     Conjunctiva/sclera: Conjunctivae normal.     Pupils: Pupils are equal, round, and reactive to light.  Neck:     Musculoskeletal: Normal range of motion and neck supple.     Comments: Tenderness to palpation of low cervical spine without step-offs or deformities.  Tenderness palpation surrounding neck musculature. No meningismus  Cardiovascular:     Rate and Rhythm: Normal rate and regular rhythm.     Pulses: Normal pulses.  Pulmonary:     Effort: Pulmonary effort is normal. No respiratory distress.     Breath sounds: Normal breath sounds. No wheezing.  Abdominal:     General: Bowel sounds are normal. There is no distension.     Palpations: Abdomen is soft. There is no mass.     Tenderness: There is no abdominal tenderness. There is no guarding or rebound.  Musculoskeletal: Normal range of motion.     Comments: TTP of low back musculature.  No tenderness palpation of the midline spine.  Ambulatory.difficulty.  No saddle paresthesias.  Strength and sensation of upper and lower extremities intact bilaterally.  Skin:    General: Skin is warm and dry.     Capillary Refill: Capillary refill takes less than 2 seconds.  Neurological:     Mental Status: He is alert and oriented to person, place, and time.      ED Treatments / Results  Labs (all labs ordered are listed, but only abnormal results are displayed) Labs Reviewed - No data to display  EKG None  Radiology No results found.  Procedures Procedures (including critical care time)  Medications Ordered in ED Medications  ketorolac (TORADOL) 15 MG/ML injection 15 mg (15 mg Intramuscular Given 11/25/18 1447)  dexamethasone (DECADRON) injection 10 mg (10 mg Intramuscular Given 11/25/18 1447)     Initial Impression / Assessment and Plan / ED Course  I have reviewed the triage vital  signs and the nursing notes.  Pertinent labs & imaging results that were available during my care of the patient were reviewed by me and considered in my medical decision making (see chart for details).     Patient presenting for evaluation of acute on chronic neck and low back pain.  Physical exam reassuring, neurovascularly intact.  No red flags for back pain.  Pain is reproducible with palpation of the musculature.  Likely musculoskeletal.  Doubt fracture, I do not believe x-rays will be beneficial.  Doubt vertebral injury, infection, spinal  cord compression, myelopathy, or cauda equina syndrome.  Will treat symptomatically with NSAIDs, muscle relaxers, muscle creams.   Additionally, patient's neck pain is chronic.  Per patient, he has DDD, and  this feels like a flare of his chronic pain.  As such, will treat with his home medications, I do not believe CT or MRI is necessary at this time, doubt spinal cord injury or compression.  Doubt infection.  Patient encouraged to continue to try and follow-up with his PCP for refill of his chronic medications.  At this time, patient appears safe for discharge.  Return precautions given.  Patient states he understands agrees plan.   Final Clinical Impressions(s) / ED Diagnoses   Final diagnoses:  Back spasm  Chronic left-sided low back pain without sciatica  Neck pain    ED Discharge Orders         Ordered    ibuprofen (ADVIL,MOTRIN) 800 MG tablet  3 times daily with meals     11/25/18 1436    cyclobenzaprine (FLEXERIL) 10 MG tablet  2 times daily PRN     11/25/18 1436           Savon Cobbs, PA-C 11/25/18 1548    Isla Pence, MD 11/25/18 [phone]

## 2018-11-25 NOTE — ED Notes (Signed)
Patient given discharge instructions and verbalized understanding.  Patient stable to discharge at this time.  Patient is alert and oriented to baseline.  No distressed noted at this time.  All belongings taken with the patient at discharge.   

## 2018-11-25 NOTE — ED Triage Notes (Signed)
Per pt he comes in due to increased chronic pain in neck and lower left side of the back 10/10.  Pt states that he ran out of his flexeril and ibuprofen recently.  NAD noted at this time.

## 2019-05-07 ENCOUNTER — Other Ambulatory Visit: Payer: Self-pay | Admitting: *Deleted

## 2019-05-07 DIAGNOSIS — Z20822 Contact with and (suspected) exposure to covid-19: Secondary | ICD-10-CM

## 2019-05-07 NOTE — Addendum Note (Signed)
Addended by: Brigitte Pulse on: 05/07/2019 08:50 PM   Modules accepted: Orders

## 2019-05-09 LAB — NOVEL CORONAVIRUS, NAA: SARS-CoV-2, NAA: NOT DETECTED

## 2020-01-03 ENCOUNTER — Emergency Department (HOSPITAL_COMMUNITY)
Admission: EM | Admit: 2020-01-03 | Discharge: 2020-01-03 | Discharge status: Against Medical Advice | Payer: Medicaid Other | Attending: Emergency Medicine | Admitting: Emergency Medicine

## 2020-01-03 ENCOUNTER — Other Ambulatory Visit: Payer: Self-pay

## 2020-01-03 ENCOUNTER — Encounter (HOSPITAL_COMMUNITY): Payer: Self-pay | Admitting: Emergency Medicine

## 2020-01-03 DIAGNOSIS — S0181XA Laceration without foreign body of other part of head, initial encounter: Secondary | ICD-10-CM | POA: Diagnosis present

## 2020-01-03 DIAGNOSIS — Y999 Unspecified external cause status: Secondary | ICD-10-CM | POA: Insufficient documentation

## 2020-01-03 DIAGNOSIS — Y9389 Activity, other specified: Secondary | ICD-10-CM | POA: Insufficient documentation

## 2020-01-03 DIAGNOSIS — X58XXXA Exposure to other specified factors, initial encounter: Secondary | ICD-10-CM | POA: Diagnosis not present

## 2020-01-03 DIAGNOSIS — Z5321 Procedure and treatment not carried out due to patient leaving prior to being seen by health care provider: Secondary | ICD-10-CM | POA: Diagnosis not present

## 2020-01-03 DIAGNOSIS — Y929 Unspecified place or not applicable: Secondary | ICD-10-CM | POA: Diagnosis not present
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
## 2020-01-22 ENCOUNTER — Ambulatory Visit: Payer: Medicaid Other | Admitting: Neurology

## 2020-01-22 ENCOUNTER — Telehealth: Payer: Self-pay

## 2020-01-22 ENCOUNTER — Encounter: Payer: Self-pay | Admitting: Neurology

## 2020-01-22 NOTE — Telephone Encounter (Signed)
Pt did not show for their appt with Dr. Athar today.  

## 2020-03-03 ENCOUNTER — Ambulatory Visit: Payer: Medicaid Other | Admitting: Neurology

## 2020-03-03 ENCOUNTER — Other Ambulatory Visit: Payer: Self-pay

## 2020-03-05 ENCOUNTER — Telehealth: Payer: Self-pay

## 2020-03-05 NOTE — Telephone Encounter (Signed)
IT states pt left but he was late for appt.

## 2020-03-19 ENCOUNTER — Other Ambulatory Visit: Payer: Self-pay

## 2020-03-19 ENCOUNTER — Encounter: Payer: Self-pay | Admitting: Physical Therapy

## 2020-03-19 ENCOUNTER — Ambulatory Visit: Payer: Medicaid Other | Attending: Nurse Practitioner | Admitting: Physical Therapy

## 2020-03-19 DIAGNOSIS — M542 Cervicalgia: Secondary | ICD-10-CM | POA: Diagnosis present

## 2020-03-19 DIAGNOSIS — M6281 Muscle weakness (generalized): Secondary | ICD-10-CM | POA: Diagnosis present

## 2020-03-19 NOTE — Therapy (Signed)
[place], [place], [address] Phone: [phone]   Fax:  [phone]  Physical Therapy Evaluation  Patient Details  Name: Steven [name] MRN: [id number] Date of Birth: [birth year] Referring Provider (PT): Andy Gauss, NP   Encounter Date: 03/19/2020  PT End of Session - 03/19/20 1520    Visit Number  1    Number of Visits  4    Date for PT Re-Evaluation  04/18/20    Authorization Type  Medicaid-requesting authorization for initial 3 visits    PT Start Time  1424    PT Stop Time  1515    PT Time Calculation (min)  51 min    Activity Tolerance  Patient limited by pain    Behavior During Therapy  [facility] for tasks assessed/performed       Past Medical History:  Diagnosis Date  . Asthma   . DDD (degenerative disc disease)   . Syphilis     History reviewed. No pertinent surgical history.  There were no vitals filed for this visit.   Subjective Assessment - 03/19/20 1434    Subjective  Pt. is a 59 y/o male referred to PT for c/o chronic neck pain. He reports insidious onset of symptoms beginning around 2005 and pain that has been worse for the past year and a half. He reports has been in pain management with opioid medication for approximately 4 months. No pain at rest at time of eval with medication but reports pain up to 10/10 without medication. He reports issues with numbness in bilateral hands otherwise no radiating symptoms noted. Pt. denies bowel or bladder changes.    Pertinent History  chronic neck pain history, chronic back pain, in pain management    Limitations  Standing;Walking;House hold activities;Lifting;Sitting    Diagnostic tests  X-rays    Patient Stated Goals  Try to ease pain and know what is able to help with pain    Currently in Pain?  --   no pain at time of eval with medication, reports pain up to 10/10 without medication        [facility] PT Assessment - 03/19/20 0001       Assessment   Medical Diagnosis  Cervical pain    Referring Provider (PT)  Andy Gauss, NP    Onset Date/Surgical Date  --   onset 2005   Hand Dominance  Right    Prior Therapy  no formal past PT for neck      Precautions   Precautions  None      Restrictions   Weight Bearing Restrictions  No      Balance Screen   Has the patient fallen in the past 6 months  Yes    How many times?  [place]  Other relatives    Type of [place] Access  Level entry    [place] - single point      Prior Function   Level of Independence  Independent with basic ADLs      Cognition   Overall Cognitive Status  Within Functional Limits for tasks assessed      Observation/Other Assessments   Focus on Therapeutic Outcomes (FOTO)   --   not assessed     Sensation   Light Touch  --  parasthesias in bilat. hands otherwise UE grossly intact     Posture/Postural Control   Posture/Postural Control  Postural limitations    Postural Limitations  Rounded Shoulders;Forward head      ROM / Strength   AROM / PROM / Strength  AROM;Strength      AROM   Overall AROM Comments  ER reach to C7 bilat. otherwise bilat. shoulder AROM grossly [facility]    AROM Assessment Site  Cervical    Cervical Flexion  55    Cervical Extension  8    Cervical - Right Side Bend  23    Cervical - Left Side Bend  19    Cervical - Right Rotation  28    Cervical - Left Rotation  41      Strength   Overall Strength Comments  Bilat. UE MMTs grossly 4/5, right grip 32 lbs., left grip 33 lbs.    Strength Assessment Site  --    Right/Left Shoulder  --    Right/Left Elbow  --    Right/Left Wrist  --      Flexibility   Soft Tissue Assessment /Muscle Length  --   tightness ib bilat. upper traps, levator     Palpation   Palpation comment  tightness bilat. upper traps and levator      Special Tests    Other special tests  limited ability to assess Spulring's ue to limited cervical ROM but pt. reported tingling in arm with attempted Spulring's on right, (+) ULTT A bilat. for median      Ambulation/Gait   Gait Comments  Pt. ambulates independently without AD with tendency flexed trunk with posture particularly after sit>stand/rest                  Objective measurements completed on examination: See above findings.      [facility] Adult PT Treatment/Exercise - 03/19/20 0001      Exercises   Exercises  --   HEP handout review            PT Education - 03/19/20 1520    Education Details  HEP, POC, pain education    Person(s) Educated  Patient    Methods  Explanation;Demonstration;Verbal cues;Handout    Comprehension  Returned demonstration;Verbalized understanding          PT Long Term Goals - 03/19/20 1529      PT LONG TERM GOAL #1   Title  Independent with HEP    Baseline  needs HEP    Time  4    Period  Weeks    Status  New    Target Date  04/18/20      PT LONG TERM GOAL #2   Title  Increase right cervical rotation AROM at least 10 deg to improve ability to turn head for IADLs and driving    Baseline  28 deg    Time  4    Period  Weeks    Status  New    Target Date  04/18/20      PT LONG TERM GOAL #3   Title  Return postural demos for upright posture with correction of  forward head/rounded shoulders to help decrease neck pain/postural tension    Baseline  forward head with rounded shoulders    Time  4    Period  Weeks    Status  New    Target Date  04/18/20  Plan - 03/19/20 1521    Clinical Impression Statement  Pt. presents with chronic neck pain and stiffness/limited neck ROM with underlying degenerative changes per X-rays. He presents with flexion bias for ROM consistent with underlying DDD/facet arthropathy. No UE radiating pain but he does present with hand parasthesias bilaterally with differential diagnosis of local  nerve pathology vs. radicular etiology. Pt. would benefit from PT to help relieve pain and work on addressing associated functional limitations.    Personal Factors and Comorbidities  Time since onset of injury/illness/exacerbation;Comorbidity 1;Finances    Comorbidities  chronic back pain    Examination-Activity Limitations  Carry;Reach Overhead;Lift;Sleep;Dressing;Bathing    Examination-Participation Restrictions  Cleaning;Community Activity    Stability/Clinical Decision Making  Evolving/Moderate complexity    Clinical Decision Making  Moderate    Rehab Potential  Fair    PT Frequency  1x / week   further visits to be requested from insurance once initial 3 tx. visits completed   PT Duration  4 weeks    PT Treatment/Interventions  Cryotherapy;Traction;ADLs/Self Care Home Management;Ultrasound;Electrical Stimulation;Moist Heat;Therapeutic activities;Functional mobility training;Therapeutic exercise;Patient/family education;Manual techniques;Dry needling;Spinal Manipulations;Neuromuscular re-education    PT Next Visit Plan  review HEP as needed, work on posture with DNF training/endurance, flexion bias cervical ROM, stretches, progress postural strengthening as tolerated, cervical manual traction vs. trial mechanical traction, modalities prn for pain    PT Home Exercise Plan  [id number]: seated cervical retractions, cervical flexion stretch, upper trap stretch, scapular retractions    Consulted and Agree with Plan of Care  Patient       Patient will benefit from skilled therapeutic intervention in order to improve the following deficits and impairments:  Pain, Postural dysfunction, Impaired UE functional use, Decreased strength, Decreased activity tolerance, Decreased range of motion, Increased muscle spasms, Impaired sensation  Visit Diagnosis: Cervicalgia  Muscle weakness (generalized)     Problem List Patient Active Problem List   Diagnosis Date Noted  . CIRRHOSIS 02/05/2009  .  CERVICAL RADICULOPATHY 02/05/2009  . 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 BURSITIS 02/05/2009  . CHEST PAIN-UNSPECIFIED 02/05/2009    Beaulah Dinning, PT, DPT 03/19/20 3:32 PM  [facility] [address] [place], [place], [address] Phone: [phone]   Fax:  [phone]  Name: Steven [name] MRN: [id number] Date of Birth: 1961-02-12

## 2020-03-21 NOTE — Addendum Note (Signed)
Addended by: Beaulah Dinning S on: 03/21/2020 02:40 PM   Modules accepted: Orders

## 2020-03-31 ENCOUNTER — Ambulatory Visit: Payer: Medicaid Other | Admitting: Physical Therapy

## 2020-04-07 ENCOUNTER — Ambulatory Visit: Payer: Medicaid Other | Admitting: Physical Therapy

## 2020-04-07 ENCOUNTER — Telehealth: Payer: Self-pay | Admitting: Physical Therapy

## 2020-04-07 NOTE — Telephone Encounter (Signed)
PT left voicemail in regards to pt's missed appointment.   Informed pt to call clinic if there are any scheduling issues or if he is no longer interested in therapy. PT reminded pt that he does have a future appointment on 6/28 and provided clinic's number.   Erhardt Dada April Gordy Levan, PT, DPT

## 2020-04-14 ENCOUNTER — Ambulatory Visit: Payer: Medicaid Other | Admitting: Physical Therapy

## 2020-04-15 ENCOUNTER — Ambulatory Visit: Payer: Medicaid Other | Admitting: Neurology

## 2020-04-15 ENCOUNTER — Telehealth: Payer: Self-pay

## 2020-04-15 ENCOUNTER — Encounter: Payer: Self-pay | Admitting: Neurology

## 2020-04-15 NOTE — Telephone Encounter (Signed)
Please follow dismissal protocol as per our No Show Policy.   

## 2020-04-15 NOTE — Telephone Encounter (Signed)
Pt no showed 04/15/2020 appt with Dr. Rexene Alberts today.  Per epic pt no showed on 01/22/2020 and arrived late on 03/03/2020 and was not seen.

## 2020-04-23 ENCOUNTER — Ambulatory Visit: Payer: Medicaid Other | Attending: Nurse Practitioner | Admitting: Physical Therapy

## 2020-04-23 ENCOUNTER — Encounter: Payer: Self-pay | Admitting: Physical Therapy

## 2020-04-23 ENCOUNTER — Other Ambulatory Visit: Payer: Self-pay

## 2020-04-23 DIAGNOSIS — M542 Cervicalgia: Secondary | ICD-10-CM | POA: Diagnosis not present

## 2020-04-23 DIAGNOSIS — M6281 Muscle weakness (generalized): Secondary | ICD-10-CM | POA: Insufficient documentation

## 2020-04-23 NOTE — Therapy (Signed)
[place], [place], [address] Phone: [phone]   Fax:  [phone]  Physical Therapy Treatment/Re-evaluation  Patient Details  Name: Steven [name] MRN: [id number] Date of Birth: May 06, 1961 Referring Provider (PT): Andy Gauss, NP   Encounter Date: 04/23/2020   PT End of Session - 04/23/20 1429    Visit Number 2    Number of Visits 5    Date for PT Re-Evaluation 05/23/20    Authorization Type Medicaid-requesting another authorization for initial 3 treatment visits    PT Start Time 2947    PT Stop Time 1449    PT Time Calculation (min) 34 min    Activity Tolerance Patient tolerated treatment well    Behavior During Therapy [facility] for tasks assessed/performed           Past Medical History:  Diagnosis Date  . Asthma   . DDD (degenerative disc disease)   . Syphilis     History reviewed. No pertinent surgical history.  There were no vitals filed for this visit.   Subjective Assessment - 04/23/20 1415    Subjective Pt. returns, not seen since initial evaluation visit and with 2 cancellations and 1 no show-he reports missed visits were due to transportation issues. He reports transportation issue is now resolved so shoulder be able to attend more consistently. He rates his neck pain as 3/10 this PM with also LBP 5/10 (PT referral for neck).    Pertinent History chronic neck pain history, chronic back pain, in pain management    Limitations Standing;Walking;House hold activities;Lifting;Sitting    Diagnostic tests X-rays    Patient Stated Goals Try to ease pain and know what is able to help with pain    Currently in Pain? Yes    Pain Score 3     Pain Location Neck    Pain Descriptors / Indicators Sharp    Pain Type Chronic pain    Pain Radiating Towards pt. reports numbness in fingertips bilaterally    Pain Onset More than a month ago    Pain Frequency Constant    Aggravating Factors  no  specific eases    Pain Relieving Factors medication              [facility] PT Assessment - 04/23/20 0001      Assessment   Medical Diagnosis Cervical pain    Referring Provider (PT) Andy Gauss, NP    Onset Date/Surgical Date --   onset 2005   Hand Dominance Right      AROM   Cervical Flexion 45    Cervical Extension 7    Cervical - Right Side Bend 35    Cervical - Left Side Bend 38    Cervical - Right Rotation 32    Cervical - Left Rotation 45      Strength   Overall Strength Comments Bilat. UE MMTs grossly 4/5, right grip 35 lbs., left grip 53 lbs.                         [facility] Adult PT Treatment/Exercise - 04/23/20 0001      Exercises   Exercises --   Updated HEP review, brief practice rows and "punches"     Modalities   Modalities Traction      Traction   Type of Traction Cervical    Min (lbs) 0    Max (lbs) 10    Hold Time 60  Rest Time 20    Time 10                  PT Education - 04/23/20 1429    Education Details traction, HEP    Person(s) Educated Patient    Methods Explanation    Comprehension Verbalized understanding               PT Long Term Goals - 04/23/20 1430      PT LONG TERM GOAL #1   Title Independent with HEP    Baseline pt. has been instructed-continue goal    Time 4    Period Weeks    Status On-going    Target Date 05/23/20      PT LONG TERM GOAL #2   Title Increase right cervical rotation AROM at least 10 deg to improve ability to turn head for IADLs and driving    Baseline 32 deg    Time 4    Period Weeks    Status On-going    Target Date 05/23/20      PT LONG TERM GOAL #3   Title Return postural demos for upright posture with correction of  forward head/rounded shoulders to help decrease neck pain/postural tension    Baseline forward head with rounded shoulders    Time 4    Period Weeks    Status On-going    Target Date 05/23/20                 Plan - 04/23/20 1430     Clinical Impression Statement Pt. returns for re-evaluation visit today having missed previously approved/scheduled treatment visits due to transportation issues which he reports are resolved/should be able to attend with improved consistency now. Overall objective status grossly similar to that at eval with cervical stiffness and diffuse UE weakness, postural compromise. Mild subjective improvement in pain with medication use. Plan resume/continue PT to try and ease pain/address associated functional limitations.    Personal Factors and Comorbidities Time since onset of injury/illness/exacerbation;Comorbidity 1;Finances    Comorbidities chronic back pain    Examination-Activity Limitations Carry;Reach Overhead;Lift;Sleep;Dressing;Bathing    Examination-Participation Restrictions Cleaning;Community Activity    Stability/Clinical Decision Making Evolving/Moderate complexity    Clinical Decision Making Moderate    Rehab Potential Fair    PT Frequency 1x / week    PT Duration 4 weeks    PT Treatment/Interventions Cryotherapy;Traction;ADLs/Self Care Home Management;Ultrasound;Electrical Stimulation;Moist Heat;Therapeutic activities;Functional mobility training;Therapeutic exercise;Patient/family education;Manual techniques;Dry needling;Spinal Manipulations;Neuromuscular re-education    PT Next Visit Plan check response trial cervical mechanical traction and continue/progress as tolerated, work on cervical flexion bias ROM, DNF endurance, postural/UE strengthening    PT Home Exercise Plan [id number]: seated cervical retractions, cervical flexion stretch, upper trap stretch, scapular retractions, Theraband rows, Theraband "punch", supine Theraband horizontal abduction, doorway pec stretch    Consulted and Agree with Plan of Care Patient           Patient will benefit from skilled therapeutic intervention in order to improve the following deficits and impairments:  Pain, Postural dysfunction, Impaired UE  functional use, Decreased strength, Decreased activity tolerance, Decreased range of motion, Increased muscle spasms, Impaired sensation  Visit Diagnosis: Cervicalgia  Muscle weakness (generalized)     Problem List Patient Active Problem List   Diagnosis Date Noted  . CIRRHOSIS 02/05/2009  . CERVICAL RADICULOPATHY 02/05/2009  . 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 BURSITIS 02/05/2009  . CHEST PAIN-UNSPECIFIED 02/05/2009    Beaulah Dinning, PT, DPT 04/23/20 2:52 PM  [facility] [address] [place], [place], [address] Phone: [phone]   Fax:  [phone]  Name: Steven [name] MRN: [id number] Date of Birth: 02-04-1961

## 2020-04-30 ENCOUNTER — Ambulatory Visit: Payer: Medicaid Other | Admitting: Physical Therapy

## 2020-05-07 ENCOUNTER — Other Ambulatory Visit: Payer: Self-pay

## 2020-05-07 ENCOUNTER — Ambulatory Visit: Payer: Medicaid Other | Admitting: Physical Therapy

## 2020-05-07 DIAGNOSIS — M542 Cervicalgia: Secondary | ICD-10-CM

## 2020-05-07 DIAGNOSIS — M6281 Muscle weakness (generalized): Secondary | ICD-10-CM

## 2020-05-07 NOTE — Therapy (Signed)
[place], [place], [address] Phone: [phone]   Fax:  [phone]  Physical Therapy Treatment  Patient Details  Name: Steven [name] MRN: [id number] Date of Birth: March 18, 1961 Referring Provider (PT): Andy Gauss, NP   Encounter Date: 05/07/2020   PT End of Session - 05/07/20 1539    Visit Number 3    Number of Visits 5    Date for PT Re-Evaluation 05/23/20    Authorization Type Medicaid-requesting another authorization for initial 3 treatment visits    PT Start Time 1532    PT Stop Time 1612    PT Time Calculation (min) 40 min    Activity Tolerance Patient tolerated treatment well    Behavior During Therapy [facility] for tasks assessed/performed           Past Medical History:  Diagnosis Date  . Asthma   . DDD (degenerative disc disease)   . Syphilis     No past surgical history on file.  There were no vitals filed for this visit.   Subjective Assessment - 05/07/20 1536    Subjective Pt states he did not feel much effect after using the traction machine. Pt reports that the exercises have helped him to relax somewhere    Pertinent History chronic neck pain history, chronic back pain, in pain management    Limitations Standing;Walking;House hold activities;Lifting;Sitting    Diagnostic tests X-rays    Patient Stated Goals Try to ease pain and know what is able to help with pain    Currently in Pain? No/denies    Pain Location Neck    Pain Onset More than a month ago                             [facility] Adult PT Treatment/Exercise - 05/07/20 0001      Exercises   Exercises Shoulder      Neck Exercises: Machines for Strengthening   UBE (Upper Arm Bike) L1 x 5 min      Neck Exercises: Theraband   Rows 10 reps;Green   with chin tuck   Shoulder External Rotation 10 reps;Green   with chin tuck     Neck Exercises: Standing   Neck Retraction 10 reps;3 secs    Other  Standing Exercises scapular retraction 2 x 10      Manual Therapy   Manual Therapy Soft tissue mobilization;Joint mobilization    Manual therapy comments self TPR with tennis ball thoracic & lumbar, theracane for cervical & neck    Joint Mobilization cervical AP grade II to III                  PT Education - 05/07/20 1614    Education Details Reinforced posture. Discussed using theracane and tennis ball for self TPR and massage at home    Person(s) Educated Patient    Methods Explanation    Comprehension Verbalized understanding;Returned demonstration               PT Long Term Goals - 04/23/20 1430      PT LONG TERM GOAL #1   Title Independent with HEP    Baseline pt. has been instructed-continue goal    Time 4    Period Weeks    Status On-going    Target Date 05/23/20      PT LONG TERM GOAL #2   Title Increase right cervical rotation AROM at  least 10 deg to improve ability to turn head for IADLs and driving    Baseline 32 deg    Time 4    Period Weeks    Status On-going    Target Date 05/23/20      PT LONG TERM GOAL #3   Title Return postural demos for upright posture with correction of  forward head/rounded shoulders to help decrease neck pain/postural tension    Baseline forward head with rounded shoulders    Time 4    Period Weeks    Status On-going    Target Date 05/23/20                 Plan - 05/07/20 1615    Clinical Impression Statement Reinforced posture and DNF endurance. Pt reports traction did not feel that it had any lasting effects. Pt responded well to manual therapy for his multiple upper trap trigger points and educated on how to perform self TPR at home with tennis ball and theracane. Pt is reporting overall improvement in his neck pain.    Personal Factors and Comorbidities Time since onset of injury/illness/exacerbation;Comorbidity 1;Finances    Comorbidities chronic back pain    Examination-Activity Limitations Carry;Reach  Overhead;Lift;Sleep;Dressing;Bathing    Examination-Participation Restrictions Cleaning;Community Activity    Stability/Clinical Decision Making Evolving/Moderate complexity    Rehab Potential Fair    PT Frequency 1x / week    PT Duration 4 weeks    PT Treatment/Interventions Cryotherapy;Traction;ADLs/Self Care Home Management;Ultrasound;Electrical Stimulation;Moist Heat;Therapeutic activities;Functional mobility training;Therapeutic exercise;Patient/family education;Manual techniques;Dry needling;Spinal Manipulations;Neuromuscular re-education    PT Next Visit Plan check response trial cervical mechanical traction and continue/progress as tolerated, work on cervical flexion bias ROM, DNF endurance, postural/UE strengthening. Add in lower trap exercises.    PT Home Exercise Plan [id number]    Consulted and Agree with Plan of Care Patient           Patient will benefit from skilled therapeutic intervention in order to improve the following deficits and impairments:  Pain, Postural dysfunction, Impaired UE functional use, Decreased strength, Decreased activity tolerance, Decreased range of motion, Increased muscle spasms, Impaired sensation  Visit Diagnosis: Muscle weakness (generalized)  Cervicalgia     Problem List Patient Active Problem List   Diagnosis Date Noted  . CIRRHOSIS 02/05/2009  . CERVICAL RADICULOPATHY 02/05/2009  . 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 BURSITIS 02/05/2009  . CHEST PAIN-UNSPECIFIED 02/05/2009    [facility] [address] [place] PT, DPT 05/07/2020, 5:08 PM  [facility] [address] [place], [place], [address] Phone: [phone]   Fax:  [phone]  Name: Steven [name] MRN: [id number] Date of Birth: [birth year]

## 2020-05-14 ENCOUNTER — Ambulatory Visit: Payer: Medicaid Other | Admitting: Physical Therapy

## 2020-06-09 NOTE — Therapy (Signed)
[place], [place], [address] Phone: [phone]   Fax:  [phone]  Physical Therapy Treatment/Discharge  Patient Details  Name: COLESON KANT MRN: [id number] Date of Birth: 11-Jan-1961 Referring Provider (PT): Andy Gauss, NP   Encounter Date: 05/07/2020    Past Medical History:  Diagnosis Date  . Asthma   . DDD (degenerative disc disease)   . Syphilis     No past surgical history on file.  There were no vitals filed for this visit.                                   PT Long Term Goals - 04/23/20 1430      PT LONG TERM GOAL #1   Title Independent with HEP    Baseline pt. has been instructed-continue goal    Time 4    Period Weeks    Status On-going    Target Date 05/23/20      PT LONG TERM GOAL #2   Title Increase right cervical rotation AROM at least 10 deg to improve ability to turn head for IADLs and driving    Baseline 32 deg    Time 4    Period Weeks    Status On-going    Target Date 05/23/20      PT LONG TERM GOAL #3   Title Return postural demos for upright posture with correction of  forward head/rounded shoulders to help decrease neck pain/postural tension    Baseline forward head with rounded shoulders    Time 4    Period Weeks    Status On-going    Target Date 05/23/20                  Patient will benefit from skilled therapeutic intervention in order to improve the following deficits and impairments:  Pain, Postural dysfunction, Impaired UE functional use, Decreased strength, Decreased activity tolerance, Decreased range of motion, Increased muscle spasms, Impaired sensation  Visit Diagnosis: Muscle weakness (generalized)  Cervicalgia     Problem List Patient Active Problem List   Diagnosis Date Noted  . CIRRHOSIS 02/05/2009  . CERVICAL RADICULOPATHY 02/05/2009  . 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 BURSITIS 02/05/2009  .  CHEST PAIN-UNSPECIFIED 02/05/2009       PHYSICAL THERAPY DISCHARGE SUMMARY  Visits from Start of Care: 3  Current functional level related to goals / functional outcomes: Patient did not return for further therapy after last session 05/07/20. Current status unknown.   Remaining deficits: Status unknown   Education / Equipment: HEP Plan: Patient agrees to discharge.  Patient goals were not met. Patient is being discharged due to not returning since the last visit.  ?????          Beaulah Dinning, PT, DPT 06/09/20 10:55 AM     [facility] [address] [place], [place], [address] Phone: [phone]   Fax:  [phone]  Name: KACETON VIEAU MRN: [id number] Date of Birth: [birth year]

## 2021-07-19 ENCOUNTER — Encounter (HOSPITAL_COMMUNITY): Payer: Self-pay | Admitting: *Deleted

## 2021-07-19 ENCOUNTER — Emergency Department (HOSPITAL_COMMUNITY)
Admission: EM | Admit: 2021-07-19 | Discharge: 2021-07-19 | Disposition: A | Discharge status: Home / Self Care | Payer: Medicaid Other | Attending: Emergency Medicine | Admitting: Emergency Medicine

## 2021-07-19 ENCOUNTER — Emergency Department (HOSPITAL_COMMUNITY): Payer: Medicaid Other

## 2021-07-19 ENCOUNTER — Other Ambulatory Visit: Payer: Self-pay

## 2021-07-19 DIAGNOSIS — R319 Hematuria, unspecified: Secondary | ICD-10-CM | POA: Insufficient documentation

## 2021-07-19 DIAGNOSIS — J45909 Unspecified asthma, uncomplicated: Secondary | ICD-10-CM | POA: Diagnosis not present

## 2021-07-19 DIAGNOSIS — F17210 Nicotine dependence, cigarettes, uncomplicated: Secondary | ICD-10-CM | POA: Diagnosis not present

## 2021-07-19 LAB — URINALYSIS, ROUTINE W REFLEX MICROSCOPIC
Bacteria, UA: NONE SEEN
Bilirubin Urine: NEGATIVE
Glucose, UA: NEGATIVE mg/dL
Ketones, ur: NEGATIVE mg/dL
Leukocytes,Ua: NEGATIVE
Nitrite: NEGATIVE
Protein, ur: NEGATIVE mg/dL
RBC / HPF: >50 RBC/hpf — ABNORMAL HIGH (ref 0–5)
Specific Gravity, Urine: 1.024 (ref 1.005–1.030)
pH: 6 (ref 5.0–8.0)

## 2021-07-19 LAB — CBC WITH DIFFERENTIAL/PLATELET
Abs Immature Granulocytes: 0.01 10*3/uL (ref 0.00–0.07)
Basophils Absolute: 0 10*3/uL (ref 0.0–0.1)
Basophils Relative: 1 %
Eosinophils Absolute: 0.2 10*3/uL (ref 0.0–0.5)
Eosinophils Relative: 3 %
HCT: 47 % (ref 39.0–52.0)
Hemoglobin: 15.4 g/dL (ref 13.0–17.0)
Immature Granulocytes: 0 %
Lymphocytes Relative: 43 %
Lymphs Abs: 3.1 10*3/uL (ref 0.7–4.0)
MCH: 29.1 pg (ref 26.0–34.0)
MCHC: 32.8 g/dL (ref 30.0–36.0)
MCV: 88.7 fL (ref 80.0–100.0)
Monocytes Absolute: 0.7 10*3/uL (ref 0.1–1.0)
Monocytes Relative: 9 %
Neutro Abs: 3.2 10*3/uL (ref 1.7–7.7)
Neutrophils Relative %: 44 %
Platelets: 157 10*3/uL (ref 150–400)
RBC: 5.3 MIL/uL (ref 4.22–5.81)
RDW: 15.1 % (ref 11.5–15.5)
WBC: 7.2 10*3/uL (ref 4.0–10.5)
nRBC: 0 % (ref 0.0–0.2)

## 2021-07-19 LAB — COMPREHENSIVE METABOLIC PANEL
ALT: 15 U/L (ref 0–44)
AST: 30 U/L (ref 15–41)
Albumin: 3.4 g/dL — ABNORMAL LOW (ref 3.5–5.0)
Alkaline Phosphatase: 75 U/L (ref 38–126)
Anion gap: 7 (ref 5–15)
BUN: 17 mg/dL (ref 6–20)
CO2: 26 mmol/L (ref 22–32)
Calcium: 9 mg/dL (ref 8.9–10.3)
Chloride: 105 mmol/L (ref 98–111)
Creatinine, Ser: 1.2 mg/dL (ref 0.61–1.24)
GFR, Estimated: >60 mL/min (ref >60)
Glucose, Bld: 146 mg/dL — ABNORMAL HIGH (ref 70–99)
Potassium: 4.4 mmol/L (ref 3.5–5.1)
Sodium: 138 mmol/L (ref 135–145)
Total Bilirubin: 0.6 mg/dL (ref 0.3–1.2)
Total Protein: 6.5 g/dL (ref 6.5–8.1)

## 2021-07-19 NOTE — ED Notes (Addendum)
Pt step outside 

## 2021-07-19 NOTE — ED Provider Notes (Signed)
[facility] Provider Note   CSN: [id number] Arrival date & time: 07/19/21  1612     History Chief Complaint  Patient presents with   Hematuria    Steven [name] is a 60 y.o. male.  HPI 59 year old male with history of asthma, degenerative disc disease, cirrhosis, syphilis presents to the ER with complaints of 1 day of hematuria.  Patient states that he started noticing blood at the end of his urine stream with some mild pain.  He denies any back pain, history of kidney stones.  No prior history of this.  He reports daily alcohol use and smokes about a pack a day of cigarettes.  States that 2 nights ago he had some homemade "hooch".  And thinks that this may have precipitated the hematuria.  States he has not had sexual intercourse in several months.  He states that he has a remote history of syphilis but this has been treated.  Denies any dizziness, syncope.    Past Medical History:  Diagnosis Date   Asthma    DDD (degenerative disc disease)    Syphilis     Patient Active Problem List   Diagnosis Date Noted   CIRRHOSIS 02/05/2009   CERVICAL RADICULOPATHY 02/05/2009   SCIATICA 02/05/2009   BURSITIS 02/05/2009   CHEST PAIN-UNSPECIFIED 02/05/2009    History reviewed. No pertinent surgical history.     Family History  Problem Relation Age of Onset   Asthma Mother    Cancer Mother     Social History   Tobacco Use   Smoking status: Every Day    Packs/day: 0.50    Types: Cigarettes   Smokeless tobacco: Never  Substance Use Topics   Alcohol use: Yes    Alcohol/week: 20.0 standard drinks    Types: 20 Cans of beer per week    Comment: every other day- moderate intake   Drug use: Yes    Types: Marijuana    Comment: marijuana 3-4 days a week, crack    Home Medications Prior to Admission medications   Medication Sig Start Date End Date Taking? Authorizing Provider  cyclobenzaprine (FLEXERIL) 10 MG tablet Take 1 tablet (10 mg  total) by mouth 2 (two) times daily as needed for muscle spasms. 11/25/18   Caccavale, Sophia, PA-C  gabapentin (NEURONTIN) 300 MG capsule Take 300 mg by mouth 1 day or 1 dose.    [provider]  HYDROcodone-acetaminophen (NORCO/VICODIN) 5-325 MG tablet Take 2 tablets by mouth every 4 (four) hours as needed. 02/07/17   Fransico Meadow, PA-C  ibuprofen (ADVIL,MOTRIN) 800 MG tablet Take 1 tablet (800 mg total) by mouth 3 (three) times daily with meals. 11/25/18   Caccavale, Sophia, PA-C  methocarbamol (ROBAXIN) 500 MG tablet Take 1 tablet (500 mg total) by mouth 2 (two) times daily. 12/09/15   [name], NP  naproxen (NAPROSYN) 500 MG tablet Take 1 tablet (500 mg total) by mouth 2 (two) times daily. 12/09/15   [name], NP  oxyCODONE-acetaminophen (PERCOCET) 5-325 MG tablet Take 1 tablet by mouth every 4 (four) hours as needed for moderate pain. 01/17/34   Delora Fuel, MD    Allergies    Patient has no known allergies.  Review of Systems   Review of Systems Ten systems reviewed and are negative for acute change, except as noted in the HPI.   Physical Exam Updated Vital Signs BP (!) 151/95 (BP Location: Right Arm)   Pulse 65   Temp 98 F (  36.7 C)   Resp 16   Ht 5\' 2"  (1.575 m)   Wt 59 kg   SpO2 96%   BMI 23.79 kg/m   Physical Exam Vitals and nursing note reviewed.  Constitutional:      Appearance: He is well-developed.  HENT:     Head: Normocephalic and atraumatic.  Eyes:     Conjunctiva/sclera: Conjunctivae normal.  Cardiovascular:     Rate and Rhythm: Normal rate and regular rhythm.     Heart sounds: No murmur heard. Pulmonary:     Effort: Pulmonary effort is normal. No respiratory distress.     Breath sounds: Normal breath sounds.  Abdominal:     Palpations: Abdomen is soft.     Tenderness: There is no abdominal tenderness. There is no right CVA tenderness or left CVA tenderness.  Genitourinary:    Comments: GU exam performed with nurse at bedside.  No evidence  of trauma to the urethral canal, no visible dried blood seen.  No visible chancres.  No scrotal/epididymal tenderness. Musculoskeletal:     Cervical back: Neck supple.  Skin:    General: Skin is warm and dry.  Neurological:     Mental Status: He is alert.    ED Results / Procedures / Treatments   Labs (all labs ordered are listed, but only abnormal results are displayed) Labs Reviewed  COMPREHENSIVE METABOLIC PANEL - Abnormal; Notable for the following components:      Result Value   Glucose, Bld 146 (*)    Albumin 3.4 (*)    All other components within normal limits  URINALYSIS, ROUTINE W REFLEX MICROSCOPIC - Abnormal; Notable for the following components:   APPearance HAZY (*)    Hgb urine dipstick LARGE (*)    RBC / HPF >50 (*)    All other components within normal limits  CBC WITH DIFFERENTIAL/PLATELET  GC/CHLAMYDIA PROBE AMP ([place]) NOT AT [facility]    EKG None  Radiology CT Renal Stone Study  Result Date: 07/19/2021 CLINICAL DATA:  Hematuria. EXAM: CT ABDOMEN AND PELVIS WITHOUT CONTRAST TECHNIQUE: Multidetector CT imaging of the abdomen and pelvis was performed following the standard protocol without IV contrast. COMPARISON:  November 05, 2013 FINDINGS: Lower chest: No acute abnormality. Hepatobiliary: No focal liver abnormality is seen. No gallstones, gallbladder wall thickening, or biliary dilatation. Pancreas: Unremarkable. No pancreatic ductal dilatation or surrounding inflammatory changes. Spleen: The spleen is small in size and otherwise normal in appearance. Adrenals/Urinary Tract: Adrenal glands are unremarkable. Kidneys are normal, without renal calculi, focal lesion, or hydronephrosis. Bladder is unremarkable. Stomach/Bowel: Stomach is within normal limits. The appendix is normal. A large amount of stool is seen throughout the colon. No evidence of bowel wall thickening, distention, or inflammatory changes. Vascular/Lymphatic: Aortic atherosclerosis. No enlarged  abdominal or pelvic lymph nodes. Reproductive: Prostate is unremarkable. Other: No abdominal wall hernia or abnormality. No abdominopelvic ascites. Musculoskeletal: Marked severity multilevel degenerative changes seen throughout the lumbar spine. IMPRESSION: 1. Large stool burden without evidence of bowel obstruction. 2. Marked severity multilevel degenerative changes throughout the lumbar spine. 3. No evidence of renal calculi. 4. Aortic atherosclerosis. Aortic Atherosclerosis (ICD10-I70.0). Electronically Signed   By: Virgina Norfolk M.D.   On: 07/19/2021 22:42    Procedures Procedures   Medications Ordered in ED Medications - No data to display  ED Course  I have reviewed the triage vital signs and the nursing notes.  Pertinent labs & imaging results that were available during my care of the patient were reviewed  by me and considered in my medical decision making (see chart for details).     MDM Rules/Calculators/A&P                           60 year old male presenting with hematuria.  On arrival, he is well-appearing, no acute distress, to complain ER bed.  He is slightly hypertensive however other vitals are overall reassuring.  Physical exam with no visible foreign bodies or trauma to the urethral canal, though he does have evidence of some dried blood surrounding the urethral meatus.  No scrotal or epididymal tenderness.  No visible chancres.  No abdominal pain or CVA tenderness.  CBC is unremarkable, normal hemoglobin.  CMP without any significant lecture normalities, normal renal function.  UA with large amount hemoglobin but with more than 50 RBCs but no evidence of infection.  CT renal stone study without any evidence of stones, large stool burden noted.  Will send for GC chlamydia, however patient has low suspicion for this and wants to wait for results of to be treated.  I stressed importance of urology follow-up, patient voices understanding.  We discussed return precautions.  He  voiced her standing and is agreeable.  Stable for discharge. Final Clinical Impression(s) / ED Diagnoses Final diagnoses:  Hematuria, unspecified type    Rx / DC Orders ED Discharge Orders     None        Lyndel Safe 07/19/21 2320    Quintella Reichert, MD 07/20/21 [phone]

## 2021-07-19 NOTE — Discharge Instructions (Addendum)
You were evaluated in the Emergency Department and after careful evaluation, we did not find any emergent condition requiring admission or further testing in the hospital.  Please make sure to call and schedule an appointment with [facility] whose contact information provided in your discharge paperwork.  Please return to the Emergency Department if you experience any worsening of your condition.    Thank you for allowing [place] to be a part of your care.

## 2021-07-19 NOTE — ED Notes (Signed)
Spoke with main lab and GC chlamydia was added on

## 2021-07-19 NOTE — ED Provider Notes (Signed)
Emergency Medicine Provider Triage Evaluation Note  Steven [name] , a 60 y.o. male  was evaluated in triage.  Pt complains of hematuria and mild dysuria since last night.  Patient denies any abdominal pain, back pain, or flank pain.  Denies any penile pain or penile discharge.  Denies any testicular pain or testicular swelling.  Patient reports that the blood happens towards the end of his stream with pain.  Review of Systems  Positive: Dysuria hematuria Negative: Pain, back pain, flank pain, fevers, chills, penile pain, discharge, testicular pain, testicular swelling  Physical Exam  BP (!) 152/93 (BP Location: Right Arm)   Pulse 71   Temp 98.9 F (37.2 C) (Oral)   Resp 16   Ht 5\' 2"  (1.575 m)   Wt 59 kg   SpO2 97%   BMI 23.79 kg/m  Gen:   Awake, no distress   Resp:  Normal effort  MSK:   Moves extremities without difficulty  Other:  No CVA tenderness. Abdomen soft, non-tender. No periotoneal signs. GU exam deferred.   Medical Decision Making  Medically screening exam initiated at 6:14 PM.  Appropriate orders placed.  Steven [name] was informed that the remainder of the evaluation will be completed by another provider, this initial triage assessment does not replace that evaluation, and the importance of remaining in the ED until their evaluation is complete.  Urinalysis and labs    Sherrell Puller, Hershal Coria 07/19/21 [place], [place], MD 07/20/21 [phone]

## 2021-07-19 NOTE — ED Triage Notes (Signed)
Pt is c/o hematuria since last night  no nown injury  and no past history

## 2021-07-20 LAB — GC/CHLAMYDIA PROBE AMP (~~LOC~~) NOT AT ARMC
Chlamydia: NEGATIVE
Comment: NEGATIVE
Comment: NORMAL
Neisseria Gonorrhea: NEGATIVE

## 2024-02-19 ENCOUNTER — Other Ambulatory Visit: Payer: Self-pay

## 2024-02-19 ENCOUNTER — Emergency Department (HOSPITAL_COMMUNITY)

## 2024-02-19 ENCOUNTER — Emergency Department (HOSPITAL_COMMUNITY)
Admission: EM | Admit: 2024-02-19 | Discharge: 2024-02-19 | Disposition: A | Discharge status: Home / Self Care | Attending: Emergency Medicine | Admitting: Emergency Medicine

## 2024-02-19 ENCOUNTER — Encounter (HOSPITAL_COMMUNITY): Payer: Self-pay | Admitting: Emergency Medicine

## 2024-02-19 DIAGNOSIS — M25551 Pain in right hip: Secondary | ICD-10-CM | POA: Diagnosis present

## 2024-02-19 DIAGNOSIS — M87051 Idiopathic aseptic necrosis of right femur: Secondary | ICD-10-CM | POA: Insufficient documentation

## 2024-02-19 DIAGNOSIS — J45909 Unspecified asthma, uncomplicated: Secondary | ICD-10-CM | POA: Insufficient documentation

## 2024-02-19 MED ORDER — MELOXICAM 15 MG PO TABS: 15.0000 mg | ORAL_TABLET | Freq: Every day | ORAL | 0 refills | Status: DC

## 2024-02-19 NOTE — ED Triage Notes (Signed)
 Pt to ED via EMS from home c/o right upper leg, hip, groin pain that started approx 8 hours PTA suddenly and without hx of same.  Denies injuries.  EMS vitals: 142/60 BP, 80 HR, 96% RA, 102 CBG.

## 2024-02-19 NOTE — ED Provider Notes (Signed)
 [facility] EMERGENCY DEPARTMENT AT [facility] Provider Note   CSN: [id number] Arrival date & time: 02/19/24  0031     History  Chief Complaint  Patient presents with   Leg Pain    Steven [name] is a 63 y.o. male.  The history is provided by the patient and medical records.  Leg Pain  63 year old male with history of asthma, degenerative disc disease, presenting to the ED for right hip and leg pain.  States this has been ongoing for about 3 months or so but worse over the past few days.  He denies any injury, trauma, or fall.  States whenever he walks he feels tension in his knee and his lateral hip.  Sometimes when coughing he feels a "pull" in his groin.  He denies any noticeable bulge.  No abdominal pain.  No vomiting or diarrhea.  He has been taking over-the-counter Excedrin and took half of a Percocet with some mild relief.  Home Medications Prior to Admission medications   Medication Sig Start Date End Date Taking? Authorizing Provider  meloxicam (MOBIC) 15 MG tablet Take 1 tablet (15 mg total) by mouth daily. 02/19/24  Yes Coretha Dew, PA-C  cyclobenzaprine  (FLEXERIL ) 10 MG tablet Take 1 tablet (10 mg total) by mouth 2 (two) times daily as needed for muscle spasms. 11/25/18   Caccavale, Sophia, PA-C  gabapentin (NEURONTIN) 300 MG capsule Take 300 mg by mouth 1 day or 1 dose.    [provider]  HYDROcodone -acetaminophen  (NORCO/VICODIN) 5-325 MG tablet Take 2 tablets by mouth every 4 (four) hours as needed. 02/07/17   Sofia, Leslie K, PA-C  ibuprofen  (ADVIL ,MOTRIN ) 800 MG tablet Take 1 tablet (800 mg total) by mouth 3 (three) times daily with meals. 11/25/18   Caccavale, Sophia, PA-C  methocarbamol  (ROBAXIN ) 500 MG tablet Take 1 tablet (500 mg total) by mouth 2 (two) times daily. 12/09/15   Gigi Kyle, NP  naproxen  (NAPROSYN ) 500 MG tablet Take 1 tablet (500 mg total) by mouth 2 (two) times daily. 12/09/15   Gigi Kyle, NP  oxyCODONE -acetaminophen  (PERCOCET)  5-325 MG tablet Take 1 tablet by mouth every 4 (four) hours as needed for moderate pain. 11/19/15   Alissa April, MD      Allergies    Patient has no known allergies.    Review of Systems   Review of Systems  Musculoskeletal:  Positive for arthralgias.  All other systems reviewed and are negative.   Physical Exam Updated Vital Signs BP (!) 167/94 (BP Location: Right Arm)   Pulse 78   Temp 98.3 F (36.8 C) (Oral)   Resp 16   Ht 5\' 5"  (1.651 m)   Wt 61.2 kg   SpO2 97%   BMI 22.47 kg/m   Physical Exam Vitals and nursing note reviewed.  Constitutional:      Appearance: He is well-developed.     Comments: Very thin in appearance  HENT:     Head: Normocephalic and atraumatic.  Eyes:     Conjunctiva/sclera: Conjunctivae normal.     Pupils: Pupils are equal, round, and reactive to light.  Cardiovascular:     Rate and Rhythm: Normal rate and regular rhythm.     Heart sounds: Normal heart sounds.  Pulmonary:     Effort: Pulmonary effort is normal.     Breath sounds: Normal breath sounds.  Abdominal:     General: Bowel sounds are normal.     Palpations: Abdomen is soft.  Genitourinary:  Comments: Exam chaperoned by RN Landon Pinion No noted bulge or swelling present, no hernia elicited with valsalva/coughing Musculoskeletal:        General: Normal range of motion.     Cervical back: Normal range of motion.     Comments: Tenderness along lateral right hip and medial knee, no acute deformity, able to flex/extend, DP pulses intact BLE  Skin:    General: Skin is warm and dry.  Neurological:     Mental Status: He is alert and oriented to person, place, and time.     ED Results / Procedures / Treatments   Labs (all labs ordered are listed, but only abnormal results are displayed) Labs Reviewed - No data to display  EKG None  Radiology DG Hip Unilat W or Wo Pelvis 2-3 Views Right Result Date: 02/19/2024 CLINICAL DATA:  Right hip pain, no known injury, initial encounter EXAM:  DG HIP (WITH OR WITHOUT PELVIS) 3V RIGHT COMPARISON:  None Available. FINDINGS: Pelvic ring is intact. Further remodeling of the right femoral head is noted likely related to avascular necrosis and subchondral fractures. No other focal abnormalities are seen. IMPRESSION: Findings suggestive of avascular necrosis within the right hip joint with remodeling of the right femoral head. Electronically Signed   By: Violeta Grey M.D.   On: 02/19/2024 01:05   DG Knee Complete 4 Views Right Result Date: 02/19/2024 CLINICAL DATA:  Hip and knee pain EXAM: RIGHT KNEE - COMPLETE 4+ VIEW COMPARISON:  None Available. FINDINGS: No acute fracture or dislocation. Chondrocalcinosis in the medial and lateral compartments. Mild tricompartmental degenerative arthritis. No knee joint effusion. IMPRESSION: No acute fracture or dislocation. Chondrocalcinosis and mild degenerative arthritis. Electronically Signed   By: Rozell Cornet M.D.   On: 02/19/2024 01:03    Procedures Procedures    Medications Ordered in ED Medications - No data to display  ED Course/ Medical Decision Making/ A&P                                 Medical Decision Making Amount and/or Complexity of Data Reviewed Radiology: ordered and independent interpretation performed.  Risk Prescription drug management.   63 year old male presenting to the ED with 3 months of right hip and knee pain.  He denies any falls or trauma.  Pain worse with certain movements.  He does not have any acute deformities on exam.  No appreciable hernia.  Leg is neurovascularly intact.  He remains ambulatory.  X-ray with findings of avascular necrosis of the right hip.  Also has some degenerative changes of the right knee.  Results discussed with patient.  Will refer to orthopedics for follow-up.  Will start on anti-inflammatories in the interim.  Can return here for any new or acute changes.  Final Clinical Impression(s) / ED Diagnoses Final diagnoses:  Avascular  necrosis of bone of right hip [facility])    Rx / DC Orders ED Discharge Orders          Ordered    meloxicam (MOBIC) 15 MG tablet  Daily        02/19/24 0130              Coretha Dew, PA-C 02/19/24 0132    Lindle Rhea, MD 02/19/24 [phone]

## 2024-02-19 NOTE — Discharge Instructions (Signed)
 As we discussed, your x-ray today was concerning for avascular necrosis of your right hip. Take the prescribed medication as directed. Follow-up with Dr. Curtiss Dowdy-- call his office to arrange appt. Return to the ED for new or worsening symptoms.

## 2024-04-06 ENCOUNTER — Ambulatory Visit: Payer: Self-pay

## 2024-04-06 NOTE — Telephone Encounter (Signed)
 FYI Only or Action Required?: FYI only for provider.  Patient was last seen in primary care on n/a. Called Nurse Triage reporting Hip Pain. Symptoms began several months ago. Interventions attempted: Prescription medications: Meloxicam , Rest, hydration, or home remedies, and Ice/heat application. Symptoms are: gradually worsening.  Triage Disposition: See PCP Within 2 Weeks  Patient/caregiver understands and will follow disposition?: Yes      Copied from CRM [id number]. Topic: Clinical - Red Word Triage >> Apr 06, 2024 10:09 AM Tiffany H wrote: Red Word that prompted transfer to Nurse Triage: Patient called to request an appointment. Patient advised that he's been suffering from severe hip pain (with origins in the groin) for the past 5 months. Patient advised that pain has gotten progressively worse. Patient is slated to have surgery when he can be cleared by PCP. Patient's current PCP is scheduling out too far. Please assist with acute care and sooner appointment. Reason for Disposition  Hip pain is a chronic symptom (recurrent or ongoing AND present > 4 weeks)  Answer Assessment - Initial Assessment Questions 1. LOCATION and RADIATION: Where is the pain located?      Right hip on outer side and right groin  2. QUALITY: What does the pain feel like?  (e.g., sharp, dull, aching, burning)     Hip pain is an achy pain, the groin feels like a tear  3. SEVERITY: How bad is the pain? What does it keep you from doing?   (Scale 1-10; or mild, moderate, severe)   -  MILD (1-3): doesn't interfere with normal activities    -  MODERATE (4-7): interferes with normal activities (e.g., work or school) or awakens from sleep, limping    -  SEVERE (8-10): excruciating pain, unable to do any normal activities, unable to walk     [place] to severe  4. ONSET: When did the pain start? Does it come and go, or is it there all the time?     5 months  5. WORK OR EXERCISE: Has there been any  recent work or exercise that involved this part of the body?      No  6. CAUSE: What do you think is causing the hip pain?      Was diagnosed with avascular necrosis back in May 2024, seen by orthoo and referred for surgery, but needs PCP clearance  7. AGGRAVATING FACTORS: What makes the hip pain worse? (e.g., walking, climbing stairs, running)     Walking, sudden movement  8. OTHER SYMPTOMS: Do you have any other symptoms? (e.g., back pain, pain shooting down leg,  fever, rash)     no  Protocols used: Hip Pain-A-AH

## 2024-04-10 ENCOUNTER — Encounter: Admitting: Family

## 2024-04-10 NOTE — Progress Notes (Signed)
 Erroneous encounter-disregard

## 2024-04-24 ENCOUNTER — Encounter: Admitting: Family

## 2024-04-24 NOTE — Progress Notes (Signed)
 Erroneous encounter-disregard

## 2024-06-28 ENCOUNTER — Encounter: Admitting: Family

## 2024-06-28 NOTE — Progress Notes (Signed)
 Erroneous encounter-disregard

## 2024-08-08 ENCOUNTER — Ambulatory Visit

## 2024-08-08 VITALS — BP 120/76 | HR 81 | Temp 97.4°F | Ht 65.0 in | Wt 114.0 lb

## 2024-08-08 DIAGNOSIS — F339 Major depressive disorder, recurrent, unspecified: Secondary | ICD-10-CM | POA: Diagnosis not present

## 2024-08-08 DIAGNOSIS — M87052 Idiopathic aseptic necrosis of left femur: Secondary | ICD-10-CM | POA: Diagnosis not present

## 2024-08-08 DIAGNOSIS — Z0000 Encounter for general adult medical examination without abnormal findings: Secondary | ICD-10-CM | POA: Insufficient documentation

## 2024-08-08 DIAGNOSIS — J3089 Other allergic rhinitis: Secondary | ICD-10-CM

## 2024-08-08 DIAGNOSIS — F17200 Nicotine dependence, unspecified, uncomplicated: Secondary | ICD-10-CM | POA: Diagnosis not present

## 2024-08-08 DIAGNOSIS — J309 Allergic rhinitis, unspecified: Secondary | ICD-10-CM | POA: Insufficient documentation

## 2024-08-08 DIAGNOSIS — Z122 Encounter for screening for malignant neoplasm of respiratory organs: Secondary | ICD-10-CM

## 2024-08-08 DIAGNOSIS — F1490 Cocaine use, unspecified, uncomplicated: Secondary | ICD-10-CM | POA: Insufficient documentation

## 2024-08-08 DIAGNOSIS — Z1159 Encounter for screening for other viral diseases: Secondary | ICD-10-CM

## 2024-08-08 DIAGNOSIS — M87059 Idiopathic aseptic necrosis of unspecified femur: Secondary | ICD-10-CM | POA: Insufficient documentation

## 2024-08-08 DIAGNOSIS — Z23 Encounter for immunization: Secondary | ICD-10-CM

## 2024-08-08 DIAGNOSIS — Z1211 Encounter for screening for malignant neoplasm of colon: Secondary | ICD-10-CM

## 2024-08-08 DIAGNOSIS — Z7689 Persons encountering health services in other specified circumstances: Secondary | ICD-10-CM

## 2024-08-08 DIAGNOSIS — F1910 Other psychoactive substance abuse, uncomplicated: Secondary | ICD-10-CM | POA: Diagnosis not present

## 2024-08-08 DIAGNOSIS — Z59819 Housing instability, housed unspecified: Secondary | ICD-10-CM

## 2024-08-08 DIAGNOSIS — Z130 Encounter for screening for diseases of the blood and blood-forming organs and certain disorders involving the immune mechanism: Secondary | ICD-10-CM

## 2024-08-08 DIAGNOSIS — Z59869 Financial insecurity, unspecified: Secondary | ICD-10-CM

## 2024-08-08 MED ORDER — CYCLOBENZAPRINE HCL 10 MG PO TABS: 10.0000 mg | ORAL_TABLET | Freq: Two times a day (BID) | ORAL | 0 refills | Status: DC | PRN

## 2024-08-08 MED ORDER — GABAPENTIN 300 MG PO CAPS: 300.0000 mg | ORAL_CAPSULE | Freq: Two times a day (BID) | ORAL | 2 refills | Status: DC

## 2024-08-08 MED ORDER — FLUTICASONE PROPIONATE 50 MCG/ACT NA SUSP
2.0000 | Freq: Every day | NASAL | 6 refills | Status: AC
Start: 2024-08-08 — End: ?

## 2024-08-08 MED ORDER — IBUPROFEN 800 MG PO TABS: 800.0000 mg | ORAL_TABLET | Freq: Three times a day (TID) | ORAL | 0 refills | Status: DC

## 2024-08-08 NOTE — Patient Instructions (Signed)
 VISIT SUMMARY: You came in today to establish care and get surgical clearance for your right hip pain, which was diagnosed as avascular necrosis. We also discussed your chronic pain, asthma, depression, and other health concerns.  YOUR PLAN: AVASCULAR NECROSIS OF RIGHT HIP WITH CHRONIC RIGHT HIP PAIN: You have chronic right hip pain due to avascular necrosis, confirmed by an x-ray. An orthopedic specialist has recommended hip replacement surgery. -Obtain surgical clearance form from your orthopedic surgeon. -We will order updated blood work for your surgical clearance. -Schedule a follow-up appointment after your blood work results are available. -Recommend using lidocaine patches for pain management.  CERVICAL DEGENERATIVE DISC DISEASE WITH CHRONIC LOW BACK PAIN AND SCIATICA: You have chronic low back pain and sciatica associated with cervical degenerative disc disease. -Increase gabapentin to 300 mg twice daily. -Refill cyclobenzaprine  and ibuprofen  prescriptions. -Advise against using controlled substances without regular monitoring.  ASTHMA: You have a history of asthma with occasional wheezing.  DEPRESSION: You reported using cannabis to manage your depression symptoms. -Refer to [facility] for assistance with mental health resources.  CANNABIS USE: You use cannabis regularly for depression management.  COCAINE USE: You have a history of cocaine use, which may contribute to your symptoms. -Refer to [facility] for substance use support.  TOBACCO USE DISORDER: You have been smoking 7-8 cigarettes daily for many years, which may contribute to your health issues. -Discuss lung cancer screening with a CT scan.  ALLERGIC RHINITIS WITH EUSTACHIAN TUBE DYSFUNCTION: You have intermittent ear pain likely due to allergic rhinitis causing eustachian tube dysfunction. -Prescribe Flonase nasal spray, two sprays in each nostril once daily. -Advise using Flonase for  at least two weeks and to report if symptoms worsen.  GENERAL HEALTH MAINTENANCE: You are due for several vaccines and screenings. -Administer flu vaccine today. -Schedule shingles and pneumonia vaccines at future visits. -Place you on the list for a colonoscopy. -Discuss lung cancer screening with a CT scan.  If you have any problems before your next visit feel free to message me via MyChart (minor issues or questions) or call the office, otherwise you may reach out to schedule an office visit.  Thank you! Saddie Sacks, PA-C

## 2024-08-08 NOTE — Assessment & Plan Note (Signed)
 Long-term tobacco use (54 years), approximately 7-8 cigarettes per day. Smoking may contribute to avascular necrosis and other health issues. - Discuss lung cancer screening with CT scan, pt agreeable. Ordered today - Referral to [facility] to assist with cessation.

## 2024-08-08 NOTE — Assessment & Plan Note (Signed)
 Intermittent ear pain and irritation, likely due to allergic rhinitis causing eustachian tube dysfunction. No infection noted. - Prescribe Flonase nasal spray, two sprays in each nostril once daily. - Advise use of Flonase for at least two weeks and to report if symptoms worsen.

## 2024-08-08 NOTE — Assessment & Plan Note (Signed)
 History of cocaine use, which led to discontinuation of pain clinic services. Cocaine use may contribute to vasoconstriction and exacerbate avascular necrosis symptoms. - Refer to [facility] for substance use support.

## 2024-08-08 NOTE — Assessment & Plan Note (Signed)
 Chronic right hip pain due to avascular necrosis, confirmed by x-ray. Orthopedic consultation recommended hip replacement surgery. Discussed potential exacerbating factors including smoking, ETOH, certain medications, and substance use. - Obtain surgical clearance form from orthopedic surgeon. - Order updated blood work for surgical clearance. - Schedule follow-up appointment after blood work results are available. - Recommend use of lidocaine patches for pain management. - Will refill flexeril , gabapentin with increase from 300 mg once daily to BID, and ibuprofen  800 mg.  - Explained that due to his admittance to other illicit substance use, I cannot refill his oxycodone  today. Patient verbalized understanding and was in agreement.

## 2024-08-08 NOTE — Assessment & Plan Note (Signed)
 Self-reported use of cannabis for symptom management. - Refer to [facility] for assistance with mental health resources.

## 2024-08-08 NOTE — Assessment & Plan Note (Signed)
 Due for flu, shingles, and pneumonia vaccines. Colon cancer screening with colonoscopy needed. Lung cancer screening discussed due to smoking history. - Administer flu vaccine today. - Schedule shingles and pneumonia vaccines at future visits. - Place on list for colonoscopy. - Discuss lung cancer screening with CT scan.

## 2024-08-08 NOTE — Progress Notes (Signed)
 New Patient Office Visit  Subjective    Patient ID: Steven [name], male    DOB: [birth year]  Age: 63 y.o. MRN: [id number]  CC:  Chief Complaint  Patient presents with   New Patient (Initial Visit)    History of Present Illness   Steven [name] is a 63 year old male with avascular necrosis who presents for establishing care and surgical clearance.  Right hip and leg pain - Acute onset of right hip and leg pain began prior to May 4th, 2025 - Initially suspected to be a pulled muscle - X-ray on May 4th, 2025 revealed avascular necrosis of the right hip - Referred to orthopedic specialist who recommended hip replacement surgery - No pain prior to the diagnosis - Expresses confusion regarding the sudden onset and etiology of avascular necrosis - Is here today because surgeon is requesting he find a PCP for surgical clearance   Chronic musculoskeletal symptoms - History of joint pain, bursitis, sciatica, and degenerative disc disease in the cervical spine - Was previously a patient at [facility] but reports he was discharged by the practice due to UDS positive for cocaine  - Current medications: gabapentin 300 mg once daily, cyclobenzaprine  (Flexeril ), ibuprofen  - Previously used Percocet; discontinued from pain clinic after positive cocaine test - Uses marijuana for depression and symptom relief  Respiratory symptoms and asthma - History of asthma - Occasional wheezing - No recent exacerbations reported - Currently smoking 1/2 ppd. Has been smoking for 54 years. Has never had lung cancer screening but is open to getting it done  Otolaryngologic symptoms - Intermittent ear pain - Left ear appears more erythematous than right - reports intermittent stabbing pain that worsens if cold air hits his ear   Tobacco use - Smokes 7-8 cigarettes daily for approximately 54 years  Preventive health and screening - No recent laboratory work since 2022 - No history of  hypertension, diabetes, or hyperlipidemia that he is aware of  - Due for shingles and pneumonia vaccines - No prior colonoscopy  - Open to flu vaccine today     Substance use and financial strain  - Patient admits to occasional use of crack or cocaine - Reports that he also regularly uses marijuana  - Denies use of other illicit substances including heroin  - Reports that he is currently staying with a family friend as he does not have stable housing or transportation  Screenings:  Colon Cancer: Never done; ordering today Lung Cancer: Never done; ordering today Breast Cancer: N/A Diabetes: Checking A1c with labs  HLD: Checking lipid panel with labs The ASCVD Risk score (Arnett DK, et al., 2019) failed to calculate for the following reasons:   Cannot find a previous HDL lab   Cannot find a previous total cholesterol lab   Outpatient Encounter Medications as of 08/08/2024  Medication Sig   fluticasone (FLONASE) 50 MCG/ACT nasal spray Place 2 sprays into both nostrils daily.   oxyCODONE -acetaminophen  (PERCOCET) 5-325 MG tablet Take 1 tablet by mouth every 4 (four) hours as needed for moderate pain.   [DISCONTINUED] cyclobenzaprine  (FLEXERIL ) 10 MG tablet Take 1 tablet (10 mg total) by mouth 2 (two) times daily as needed for muscle spasms.   [DISCONTINUED] gabapentin (NEURONTIN) 300 MG capsule Take 300 mg by mouth 1 day or 1 dose.   [DISCONTINUED] ibuprofen  (ADVIL ,MOTRIN ) 800 MG tablet Take 1 tablet (800 mg total) by mouth 3 (three) times daily with meals.   cyclobenzaprine  (FLEXERIL ) 10 MG tablet  Take 1 tablet (10 mg total) by mouth 2 (two) times daily as needed for muscle spasms.   gabapentin (NEURONTIN) 300 MG capsule Take 1 capsule (300 mg total) by mouth 2 (two) times daily.   ibuprofen  (ADVIL ) 800 MG tablet Take 1 tablet (800 mg total) by mouth 3 (three) times daily with meals.   [DISCONTINUED] HYDROcodone -acetaminophen  (NORCO/VICODIN) 5-325 MG tablet Take 2 tablets by mouth every 4  (four) hours as needed. (Patient not taking: Reported on 08/08/2024)   [DISCONTINUED] meloxicam  (MOBIC ) 15 MG tablet Take 1 tablet (15 mg total) by mouth daily. (Patient not taking: Reported on 08/08/2024)   [DISCONTINUED] methocarbamol  (ROBAXIN ) 500 MG tablet Take 1 tablet (500 mg total) by mouth 2 (two) times daily. (Patient not taking: Reported on 08/08/2024)   [DISCONTINUED] naproxen  (NAPROSYN ) 500 MG tablet Take 1 tablet (500 mg total) by mouth 2 (two) times daily. (Patient not taking: Reported on 08/08/2024)   No facility-administered encounter medications on file as of 08/08/2024.    Past Medical History:  Diagnosis Date   Asthma    DDD (degenerative disc disease)    Syphilis     No past surgical history on file.  Family History  Problem Relation Age of Onset   Asthma Mother    Cancer Mother     Social History   Socioeconomic History   Marital status: Legally Separated    Spouse name: Not on file   Number of children: Not on file   Years of education: Not on file   Highest education level: Not on file  Occupational History   Not on file  Tobacco Use   Smoking status: Every Day    Current packs/day: 0.50    Types: Cigarettes   Smokeless tobacco: Never  Substance and Sexual Activity   Alcohol use: Yes    Alcohol/week: 20.0 standard drinks of alcohol    Types: 20 Cans of beer per week    Comment: every other day- moderate intake   Drug use: Yes    Types: Marijuana    Comment: marijuana 3-4 days a week, crack   Sexual activity: Not on file  Other Topics Concern   Not on file  Social History Narrative   Not on file   Social Drivers of Health   Financial Resource Strain: Not on File (02/04/2022)   Received from [employer]    Financial Resource Strain: 0  Food Insecurity: Food Insecurity Present (08/08/2024)   Hunger Vital Sign    Worried About Running Out of Food in the Last Year: Sometimes true    Ran Out of Food in the Last Year:  Sometimes true  Transportation Needs: Unmet Transportation Needs (08/08/2024)   PRAPARE - Administrator, Civil Service (Medical): Yes    Lack of Transportation (Non-Medical): Yes  Physical Activity: Not on File (02/04/2022)   Received from [facility]   Physical Activity    Physical Activity: 0  Stress: Not on File (02/04/2022)   Received from [facility]   Stress    Stress: 0  Social Connections: Not on File (07/02/2023)   Received from [facility]   Social Connections    Connectedness: 0  Intimate Partner Violence: Not At Risk (08/08/2024)   Humiliation, Afraid, Rape, and Kick questionnaire    Fear of Current or Ex-Partner: No    Emotionally Abused: No    Physically Abused: No    Sexually Abused: No    ROS  Per HPI  Objective    BP 120/76   Pulse 81   Temp (!) 97.4 F (36.3 C) (Oral)   Ht 5' 5 (1.651 m)   Wt 114 lb (51.7 kg)   SpO2 99%   BMI 18.97 kg/m   Physical Exam Constitutional:      General: He is not in acute distress.    Appearance: Normal appearance.  HENT:     Right Ear: A middle ear effusion is present. Tympanic membrane is injected.     Left Ear: A middle ear effusion is present. Tympanic membrane is injected.  Cardiovascular:     Rate and Rhythm: Normal rate and regular rhythm.     Heart sounds: Normal heart sounds. No murmur heard.    No friction rub. No gallop.  Pulmonary:     Effort: Pulmonary effort is normal. No respiratory distress.     Breath sounds: Normal breath sounds.  Musculoskeletal:        General: No swelling.  Skin:    General: Skin is warm and dry.  Neurological:     General: No focal deficit present.     Mental Status: He is alert.  Psychiatric:        Mood and Affect: Mood normal.        Behavior: Behavior normal.        Thought Content: Thought content normal.          Assessment & Plan:   Encounter for vaccination -     Flu vaccine trivalent PF, 6mos and older(Flulaval,Afluria,Fluarix,Fluzone)  Housing  instability -     AMB Referral [facility] Care Management  Financial insecurity -     AMB Referral [facility] Care Management  Substance abuse (HCC) -     AMB Referral [facility] Care Management  Avascular necrosis of bone of left hip (HCC) -     Cyclobenzaprine  HCl; Take 1 tablet (10 mg total) by mouth 2 (two) times daily as needed for muscle spasms.  Dispense: 20 tablet; Refill: 0 -     Gabapentin; Take 1 capsule (300 mg total) by mouth 2 (two) times daily.  Dispense: 180 capsule; Refill: 2 -     Ibuprofen ; Take 1 tablet (800 mg total) by mouth 3 (three) times daily with meals.  Dispense: 21 tablet; Refill: 0  Screen for colon cancer -     Ambulatory referral to Gastroenterology  Screening for lung cancer -     CT CHEST LUNG CANCER SCREENING LOW DOSE WO CONTRAST; Future  Screening for viral disease -     Hepatitis C antibody; Future  Screening for endocrine, nutritional, metabolic and immunity disorder -     VITAMIN D 25 Hydroxy (Vit-D Deficiency, Fractures); Future -     CBC with Differential/Platelet; Future -     Hemoglobin A1c; Future -     Comprehensive metabolic panel with GFR; Future -     Lipid panel; Future -     TSH; Future  Other orders -     Fluticasone Propionate; Place 2 sprays into both nostrils daily.  Dispense: 16 g; Refill: 6    Assessment and Plan    Avascular necrosis of right hip with chronic right hip pain Chronic right hip pain due to avascular necrosis, confirmed by x-ray. Orthopedic consultation recommended hip replacement surgery. Discussed potential exacerbating factors including hypertension, hyperlipidemia, osteoporosis, certain medications, and substance use. - Obtain surgical clearance form from orthopedic surgeon. - Order updated blood work for surgical clearance. - Schedule follow-up appointment after  blood work results are available. - Recommend use of lidocaine patches for pain management.  Cervical degenerative disc disease with chronic low back pain  and sciatica Chronic low back pain and sciatica associated with cervical degenerative disc disease. Pain management previously included gabapentin, cyclobenzaprine , and ibuprofen . Percocet use discontinued due to cocaine detection. - Increase gabapentin to 300 mg twice daily. - Refill cyclobenzaprine  and ibuprofen  prescriptions. - Advise against use of controlled substances without regular monitoring.  Asthma  Depression Self-reported use of cannabis for symptom management. - Refer to [facility] for assistance with mental health resources.  Cannabis use Regular cannabis use reported for depression management.  Cocaine use History of cocaine use, which led to discontinuation of pain clinic services. Cocaine use may contribute to vasoconstriction and exacerbate avascular necrosis symptoms. - Refer to [facility] for substance use support.  Tobacco use disorder Long-term tobacco use, approximately 7-8 cigarettes per day. Smoking may contribute to avascular necrosis and other health issues. - Discuss lung cancer screening with CT scan.  Allergic rhinitis with eustachian tube dysfunction Intermittent ear pain and irritation, likely due to allergic rhinitis causing eustachian tube dysfunction. No infection noted. - Prescribe Flonase nasal spray, two sprays in each nostril once daily. - Advise use of Flonase for at least two weeks and to report if symptoms worsen.  General Health Maintenance Due for flu, shingles, and pneumonia vaccines. Colon cancer screening with colonoscopy needed. Lung cancer screening discussed due to smoking history. - Administer flu vaccine today. - Schedule shingles and pneumonia vaccines at future visits. - Place on list for colonoscopy. - Discuss lung cancer screening with CT scan.         Return in about 8 weeks (around 10/03/2024) for Med management/go over labs .   Saddie JULIANNA Sacks, PA-C

## 2024-08-09 ENCOUNTER — Telehealth: Payer: Self-pay | Admitting: *Deleted

## 2024-08-09 NOTE — Progress Notes (Signed)
 Complex Care Management Note Care Guide Note  08/09/2024 Name: Steven [name] MRN: [id number] DOB: [birth year]   Complex Care Management Outreach Attempts: An unsuccessful telephone outreach was attempted today to offer the patient information about available complex care management services.  Follow Up Plan:  Additional outreach attempts will be made to offer the patient complex care management information and services.   Encounter Outcome:  No Answer  Thedford Franks, CMA [place]  [facility], [facility] Guide Direct Dial: [phone]  Fax: [phone] Website: [email or web address]

## 2024-08-10 NOTE — Progress Notes (Signed)
 Complex Care Management Note Care Guide Note  08/10/2024 Name: Steven [name] MRN: [id number] DOB: 1961-10-05   Complex Care Management Outreach Attempts: A second unsuccessful outreach was attempted today to offer the patient with information about available complex care management services.  Follow Up Plan:  Additional outreach attempts will be made to offer the patient complex care management information and services.   Encounter Outcome:  No Answer  Steven [name], CMA [place]  [facility], [facility] Guide Direct Dial: [phone]  Fax: [phone] Website: [email or web address]

## 2024-08-13 ENCOUNTER — Telehealth: Payer: Self-pay

## 2024-08-13 NOTE — Telephone Encounter (Signed)
 Copied from CRM [id number]. Topic: Clinical - Medical Advice >> Aug 13, 2024  4:22 PM Kevelyn M wrote: Reason for CRM: Patient called [facility]. Patient needs a letter from his provider stating that he is healthy enough for surgery sent to . Pre-op clearance for total hip replacement.  Fax#[phone]  Melissa Call back #[phone]

## 2024-08-13 NOTE — Progress Notes (Signed)
 Complex Care Management Note Care Guide Note  08/13/2024 Name: Steven [name] MRN: [id number] DOB: 05-20-1961   Complex Care Management Outreach Attempts: A third unsuccessful outreach was attempted today to offer the patient with information about available complex care management services.  Follow Up Plan:  No further outreach attempts will be made at this time. We have been unable to contact the patient to offer or enroll patient in complex care management services.  Encounter Outcome:  No Answer  Thedford Franks, CMA [place]  [facility], [facility] Guide Direct Dial: [phone]  Fax: [phone] Website: [email or web address]

## 2024-08-13 NOTE — Telephone Encounter (Signed)
 Patient will have to have surgical clearance form faxed to our office and I can fill it out

## 2024-08-15 ENCOUNTER — Other Ambulatory Visit

## 2024-08-15 DIAGNOSIS — Z1159 Encounter for screening for other viral diseases: Secondary | ICD-10-CM

## 2024-08-15 DIAGNOSIS — Z130 Encounter for screening for diseases of the blood and blood-forming organs and certain disorders involving the immune mechanism: Secondary | ICD-10-CM

## 2024-08-15 NOTE — Telephone Encounter (Signed)
 Patient needs to have lab work done and an EKG before I can clear him for surgery and he no showed his lab appointment yesterday so I cannot clear him

## 2024-08-15 NOTE — Telephone Encounter (Signed)
 Patient is scheduled for EKG for 08/22/24 at 11:00.

## 2024-08-22 ENCOUNTER — Ambulatory Visit (INDEPENDENT_AMBULATORY_CARE_PROVIDER_SITE_OTHER)

## 2024-08-22 ENCOUNTER — Other Ambulatory Visit

## 2024-08-22 ENCOUNTER — Other Ambulatory Visit: Payer: Self-pay

## 2024-08-22 VITALS — BP 158/98 | HR 73 | Temp 97.7°F | Ht 65.0 in | Wt 114.0 lb

## 2024-08-22 DIAGNOSIS — Z01810 Encounter for preprocedural cardiovascular examination: Secondary | ICD-10-CM

## 2024-08-22 DIAGNOSIS — I10 Essential (primary) hypertension: Secondary | ICD-10-CM

## 2024-08-22 MED ORDER — AMLODIPINE BESYLATE 5 MG PO TABS: 5.0000 mg | ORAL_TABLET | Freq: Every day | ORAL | 3 refills | Status: AC

## 2024-08-22 NOTE — Progress Notes (Signed)
 Patient came in today for lab work and EKG as part of his surgical clearance that his orthopedic surgeon is requesting from our office.  His blood pressure was elevated in the office at 158/98 and was no better after several rechecks automatically and manually.  Given his previously elevated readings documented in his chart, this does meet the diagnosis of essential hypertension.  I have sent in amlodipine 5 mg for his blood pressure and we have scheduled him for a 2-week nurse visit to recheck blood pressure to ensure that it is at goal before surgical clearance.  Saddie JULIANNA Sacks, PA-C

## 2024-08-22 NOTE — Progress Notes (Signed)
 Patient in office for Surgery Clearance.

## 2024-08-23 ENCOUNTER — Ambulatory Visit: Payer: Self-pay

## 2024-08-23 LAB — CBC WITH DIFFERENTIAL/PLATELET

## 2024-08-23 MED ORDER — CHOLECALCIFEROL 1.25 MG (50000 UT) PO TABS: 50000.0000 [IU] | ORAL_TABLET | ORAL | 0 refills | Status: AC

## 2024-08-23 NOTE — Progress Notes (Signed)
 Called pt unable to LVM patient has no VM set up

## 2024-08-24 ENCOUNTER — Other Ambulatory Visit: Payer: Self-pay

## 2024-08-24 LAB — LIPID PANEL
Chol/HDL Ratio: 2.8 ratio (ref 0.0–5.0)
Cholesterol, Total: 131 mg/dL (ref 100–199)
HDL: 46 mg/dL (ref >39)
LDL Chol Calc (NIH): 70 mg/dL (ref 0–99)
Triglycerides: 72 mg/dL (ref 0–149)
VLDL Cholesterol Cal: 15 mg/dL (ref 5–40)

## 2024-08-24 LAB — COMPREHENSIVE METABOLIC PANEL WITH GFR
ALT: 10 IU/L (ref 0–44)
AST: 21 IU/L (ref 0–40)
Albumin: 3.9 g/dL (ref 3.9–4.9)
Alkaline Phosphatase: 116 IU/L (ref 47–123)
BUN/Creatinine Ratio: 10 (ref 10–24)
BUN: 10 mg/dL (ref 8–27)
Bilirubin Total: 0.4 mg/dL (ref 0.0–1.2)
CO2: 24 mmol/L (ref 20–29)
Calcium: 9.4 mg/dL (ref 8.6–10.2)
Chloride: 100 mmol/L (ref 96–106)
Creatinine, Ser: 1.01 mg/dL (ref 0.76–1.27)
Globulin, Total: 3.3 g/dL (ref 1.5–4.5)
Glucose: 67 mg/dL — ABNORMAL LOW (ref 70–99)
Potassium: 4.6 mmol/L (ref 3.5–5.2)
Sodium: 138 mmol/L (ref 134–144)
Total Protein: 7.2 g/dL (ref 6.0–8.5)
eGFR: 84 mL/min/1.73 (ref >59)

## 2024-08-24 LAB — VITAMIN D 25 HYDROXY (VIT D DEFICIENCY, FRACTURES): Vit D, 25-Hydroxy: 12.1 ng/mL — ABNORMAL LOW (ref 30.0–100.0)

## 2024-08-24 LAB — HEPATITIS C ANTIBODY: Hep C Virus Ab: NONREACTIVE

## 2024-08-24 LAB — CBC WITH DIFFERENTIAL/PLATELET
Basos: 1 %
EOS (ABSOLUTE): 0 x10E3/uL (ref 0.0–0.2)
Eos: 2 %
Hematocrit: 47.2 % (ref 37.5–51.0)
Hemoglobin: 15.3 g/dL (ref 13.0–17.7)
Immature Granulocytes: 0 %
Immature Granulocytes: 0 x10E3/uL (ref 0.0–0.1)
Lymphs: 47 %
MCH: 27.9 pg (ref 26.6–33.0)
MCHC: 32.4 g/dL (ref 31.5–35.7)
MCV: 86 fL (ref 79–97)
Monocytes Absolute: 0.1 x10E3/uL (ref 0.0–0.4)
Monocytes Absolute: 0.4 x10E3/uL (ref 0.1–0.9)
Monocytes: 7 %
Neutrophils Absolute: 2.4 x10E3/uL (ref 1.4–7.0)
Neutrophils Absolute: 2.6 x10E3/uL (ref 0.7–3.1)
Neutrophils: 43 %
Platelets: 119 x10E3/uL — AB (ref 150–450)
RBC: 5.48 x10E6/uL (ref 4.14–5.80)
RDW: 13.5 % (ref 11.6–15.4)
WBC: 5.6 x10E3/uL (ref 3.4–10.8)

## 2024-08-24 LAB — TSH: TSH: 1.34 u[IU]/mL (ref 0.450–4.500)

## 2024-08-24 LAB — HEMOGLOBIN A1C
Est. average glucose Bld gHb Est-mCnc: 114 mg/dL
Hgb A1c MFr Bld: 5.6 % (ref 4.8–5.6)

## 2024-08-24 NOTE — Progress Notes (Signed)
 Called pt unable to LVM no VM set up also called alternate number not able to LVM

## 2024-09-05 ENCOUNTER — Ambulatory Visit (INDEPENDENT_AMBULATORY_CARE_PROVIDER_SITE_OTHER): Admitting: Family Medicine

## 2024-09-05 VITALS — BP 164/86 | Ht 65.0 in | Wt 122.1 lb

## 2024-09-05 DIAGNOSIS — I10 Essential (primary) hypertension: Secondary | ICD-10-CM

## 2024-09-07 NOTE — Progress Notes (Signed)
Bp recheck

## 2024-09-20 ENCOUNTER — Other Ambulatory Visit: Payer: Self-pay

## 2024-09-20 DIAGNOSIS — M87052 Idiopathic aseptic necrosis of left femur: Secondary | ICD-10-CM

## 2024-09-20 MED ORDER — IBUPROFEN 800 MG PO TABS: 800.0000 mg | ORAL_TABLET | Freq: Three times a day (TID) | ORAL | 0 refills | Status: DC

## 2024-09-20 MED ORDER — CYCLOBENZAPRINE HCL 10 MG PO TABS: 10.0000 mg | ORAL_TABLET | Freq: Two times a day (BID) | ORAL | 0 refills | Status: DC | PRN

## 2024-09-20 NOTE — Telephone Encounter (Signed)
 Copied from CRM #[id number]. Topic: Clinical - Medication Refill >> Sep 20, 2024  9:37 AM Steven [name] wrote: Medication: ibuprofen  and flexeril  (patient stated he did mention the refills to the provider when he was in the office last)  Has the patient contacted their pharmacy? Yes (Agent: If no, request that the patient contact the pharmacy for the refill. If patient does not wish to contact the pharmacy document the reason why and proceed with request.) (Agent: If yes, when and what did the pharmacy advise?)  This is the patient's preferred pharmacy:  [facility] [address] [place], [place] - [address] [place] [address] Phone: [phone] Fax: [phone]  Is this the correct pharmacy for this prescription? Yes If no, delete pharmacy and type the correct one.   Has the prescription been filled recently? Yes  Is the patient out of the medication? Yes  Has the patient been seen for an appointment in the last year OR does the patient have an upcoming appointment? Yes  Can we respond through MyChart? Yes  Agent: Please be advised that Rx refills may take up to 3 business days. We ask that you follow-up with your pharmacy.

## 2024-10-08 ENCOUNTER — Ambulatory Visit

## 2024-11-01 ENCOUNTER — Other Ambulatory Visit: Payer: Self-pay

## 2024-11-01 DIAGNOSIS — M87052 Idiopathic aseptic necrosis of left femur: Secondary | ICD-10-CM

## 2024-11-01 MED ORDER — GABAPENTIN 300 MG PO CAPS: 300.0000 mg | ORAL_CAPSULE | Freq: Two times a day (BID) | ORAL | 2 refills | Status: AC

## 2024-11-01 MED ORDER — CYCLOBENZAPRINE HCL 10 MG PO TABS: 10.0000 mg | ORAL_TABLET | Freq: Two times a day (BID) | ORAL | 0 refills | Status: AC | PRN

## 2024-11-01 MED ORDER — IBUPROFEN 800 MG PO TABS: 800.0000 mg | ORAL_TABLET | Freq: Three times a day (TID) | ORAL | 0 refills | Status: AC

## 2024-11-01 NOTE — Telephone Encounter (Signed)
 Copied from CRM #[id number]. Topic: Clinical - Medication Refill >> Nov 01, 2024  2:19 PM Rachelle R wrote: Medication: gabapentin  (NEURONTIN ) 300 MG capsule ibuprofen  (ADVIL ) 800 MG tablet cyclobenzaprine  (FLEXERIL ) 10 MG tablet   Has the patient contacted their pharmacy? Yes, call dr  This is the patient's preferred pharmacy:  [name] [address] [place], [place] - [address] [place] [address] Phone: [phone] Fax: [phone]  Is this the correct pharmacy for this prescription? Yes  Has the prescription been filled recently? No  Is the patient out of the medication? Yes  Has the patient been seen for an appointment in the last year OR does the patient have an upcoming appointment? Yes  Can we respond through MyChart? No  Agent: Please be advised that Rx refills may take up to 3 business days. We ask that you follow-up with your pharmacy.

## 2024-11-20 ENCOUNTER — Ambulatory Visit

## 2024-11-20 ENCOUNTER — Telehealth: Payer: Self-pay

## 2024-11-20 NOTE — Telephone Encounter (Signed)
 LVM for pt to call office to inform him of the below information from provider and to supply him with the number to the office that is mentioned below.  The number is [phone]

## 2024-11-20 NOTE — Telephone Encounter (Signed)
 Copied from CRM #[id number]. Topic: Clinical - Medical Advice >> Nov 20, 2024  2:07 PM Tiffini S wrote: Reason for CRM: Patient needs an update on when he will be scheduled for surgery with the orthopedic doctor for a hip replacement  Please call the patient back at  [phone]

## 2024-11-21 NOTE — Telephone Encounter (Signed)
 Gave the patient the message below from provider.     Steven [name], [place] to Me (Selected Message)     11/20/24  4:54 PM He was referred by his last PCP and was scheduled for surgery with [facility] in [place]. I filled out the surgery clearance months ago.    He needs to call them and figure out when he is supposed to go

## 2024-12-26 ENCOUNTER — Ambulatory Visit
